# Patient Record
Sex: Female | Born: 1980 | Race: White | Hispanic: No | Smoking: Never smoker
Health system: Southern US, Community
[De-identification: ages and names within clinical notes are randomized; demographics above are authoritative.]

## PROBLEM LIST (undated history)

## (undated) ENCOUNTER — Inpatient Hospital Stay (HOSPITAL_COMMUNITY): Payer: Self-pay

## (undated) DIAGNOSIS — F99 Mental disorder, not otherwise specified: Secondary | ICD-10-CM

## (undated) DIAGNOSIS — N200 Calculus of kidney: Secondary | ICD-10-CM

## (undated) DIAGNOSIS — N390 Urinary tract infection, site not specified: Secondary | ICD-10-CM

## (undated) DIAGNOSIS — J4520 Mild intermittent asthma, uncomplicated: Secondary | ICD-10-CM

## (undated) DIAGNOSIS — J069 Acute upper respiratory infection, unspecified: Secondary | ICD-10-CM

## (undated) HISTORY — PX: TYMPANOSTOMY TUBE PLACEMENT: SHX32

## (undated) HISTORY — PX: NO PAST SURGERIES: SHX2092

## (undated) HISTORY — DX: Acute upper respiratory infection, unspecified: J06.9

## (undated) HISTORY — DX: Mild intermittent asthma, uncomplicated: J45.20

---
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
## 2000-02-20 ENCOUNTER — Other Ambulatory Visit: Admission: RE | Admit: 2000-02-20 | Discharge: 2000-02-20 | Payer: Self-pay | Admitting: Gynecology

## 2000-12-31 ENCOUNTER — Encounter: Admission: RE | Admit: 2000-12-31 | Discharge: 2000-12-31 | Payer: Self-pay | Admitting: Family Medicine

## 2000-12-31 ENCOUNTER — Encounter: Payer: Self-pay | Admitting: Family Medicine

## 2001-04-15 ENCOUNTER — Other Ambulatory Visit: Admission: RE | Admit: 2001-04-15 | Discharge: 2001-04-15 | Payer: Self-pay | Admitting: Gynecology

## 2001-04-15 ENCOUNTER — Other Ambulatory Visit: Admission: RE | Admit: 2001-04-15 | Discharge: 2001-04-15 | Payer: Self-pay | Admitting: Obstetrics and Gynecology

## 2010-09-23 ENCOUNTER — Emergency Department (HOSPITAL_COMMUNITY)
Admission: EM | Admit: 2010-09-23 | Discharge: 2010-09-24 | Payer: Self-pay | Source: Home / Self Care | Admitting: Emergency Medicine

## 2010-12-27 LAB — DIFFERENTIAL
Basophils Absolute: 0 10*3/uL (ref 0.0–0.1)
Basophils Relative: 0 % (ref 0–1)
Lymphocytes Relative: 14 % (ref 12–46)
Monocytes Relative: 7 % (ref 3–12)
Neutro Abs: 10.1 10*3/uL — ABNORMAL HIGH (ref 1.7–7.7)
Neutrophils Relative %: 77 % (ref 43–77)

## 2010-12-27 LAB — BASIC METABOLIC PANEL
BUN: 9 mg/dL (ref 6–23)
CO2: 25 mEq/L (ref 19–32)
Calcium: 9.1 mg/dL (ref 8.4–10.5)
Chloride: 107 mEq/L (ref 96–112)
Creatinine, Ser: 0.47 mg/dL (ref 0.4–1.2)
GFR calc Af Amer: >60 mL/min (ref >60)
GFR calc non Af Amer: >60 mL/min (ref >60)
Glucose, Bld: 90 mg/dL (ref 70–99)
Potassium: 3.8 mEq/L (ref 3.5–5.1)
Sodium: 140 mEq/L (ref 135–145)

## 2010-12-27 LAB — CBC
HCT: 32.7 % — ABNORMAL LOW (ref 36.0–46.0)
Hemoglobin: 10.8 g/dL — ABNORMAL LOW (ref 12.0–15.0)
MCH: 29.2 pg (ref 26.0–34.0)
MCHC: 33 g/dL (ref 30.0–36.0)
MCV: 88.4 fL (ref 78.0–100.0)
Platelets: 338 10*3/uL (ref 150–400)
RBC: 3.7 MIL/uL — ABNORMAL LOW (ref 3.87–5.11)
RDW: 13.8 % (ref 11.5–15.5)
WBC: 13.1 10*3/uL — ABNORMAL HIGH (ref 4.0–10.5)

## 2010-12-27 LAB — URINE MICROSCOPIC-ADD ON

## 2010-12-27 LAB — URINALYSIS, ROUTINE W REFLEX MICROSCOPIC
Bilirubin Urine: NEGATIVE
Glucose, UA: NEGATIVE mg/dL
Ketones, ur: NEGATIVE mg/dL
Leukocytes, UA: NEGATIVE
Nitrite: NEGATIVE
Protein, ur: 30 mg/dL — AB
Specific Gravity, Urine: 1.031 — ABNORMAL HIGH (ref 1.005–1.030)
Urobilinogen, UA: 0.2 mg/dL (ref 0.0–1.0)
pH: 6 (ref 5.0–8.0)

## 2010-12-27 LAB — URINE CULTURE
Colony Count: NO GROWTH
Culture  Setup Time: 201112100453
Culture: NO GROWTH

## 2011-02-25 ENCOUNTER — Inpatient Hospital Stay (HOSPITAL_COMMUNITY)
Admission: AD | Admit: 2011-02-25 | Discharge: 2011-02-27 | DRG: 775 | Disposition: A | Discharge status: Home / Self Care | Payer: Medicaid Other | Source: Ambulatory Visit | Attending: Obstetrics and Gynecology | Admitting: Obstetrics and Gynecology

## 2011-02-25 DIAGNOSIS — O9903 Anemia complicating the puerperium: Secondary | ICD-10-CM | POA: Diagnosis not present

## 2011-02-25 DIAGNOSIS — D500 Iron deficiency anemia secondary to blood loss (chronic): Secondary | ICD-10-CM | POA: Diagnosis not present

## 2011-02-25 LAB — CBC
Hemoglobin: 9.7 g/dL — ABNORMAL LOW (ref 12.0–15.0)
MCH: 25.7 pg — ABNORMAL LOW (ref 26.0–34.0)
RBC: 3.77 MIL/uL — ABNORMAL LOW (ref 3.87–5.11)

## 2011-02-25 LAB — RPR: RPR Ser Ql: NONREACTIVE

## 2011-02-26 LAB — CBC
HCT: 22.4 % — ABNORMAL LOW (ref 36.0–46.0)
Hemoglobin: 6.9 g/dL — CL (ref 12.0–15.0)
WBC: 17.3 10*3/uL — ABNORMAL HIGH (ref 4.0–10.5)

## 2011-02-28 LAB — RH IMMUNE GLOB WKUP(>/=20WKS)(NOT WOMEN'S HOSP)
DAT, IgG: NEGATIVE
Fetal Screen: NEGATIVE

## 2011-03-08 ENCOUNTER — Inpatient Hospital Stay (HOSPITAL_COMMUNITY): Admission: AD | Admit: 2011-03-08 | Payer: Self-pay | Source: Ambulatory Visit | Admitting: Obstetrics & Gynecology

## 2011-11-28 ENCOUNTER — Ambulatory Visit (INDEPENDENT_AMBULATORY_CARE_PROVIDER_SITE_OTHER): Payer: Self-pay | Admitting: Pediatrics

## 2011-11-28 DIAGNOSIS — Z710 Person encountering health services to consult on behalf of another person: Secondary | ICD-10-CM

## 2011-11-28 NOTE — Progress Notes (Signed)
Counseled on new patient visit

## 2011-12-13 ENCOUNTER — Ambulatory Visit: Payer: Medicaid Other | Admitting: Pediatrics

## 2012-07-02 LAB — OB RESULTS CONSOLE ANTIBODY SCREEN: Antibody Screen: NEGATIVE

## 2012-07-02 LAB — OB RESULTS CONSOLE GC/CHLAMYDIA
Chlamydia: NEGATIVE
Gonorrhea: NEGATIVE

## 2012-07-02 LAB — OB RESULTS CONSOLE RUBELLA ANTIBODY, IGM: Rubella: IMMUNE

## 2012-07-02 LAB — OB RESULTS CONSOLE ABO/RH

## 2012-08-07 ENCOUNTER — Inpatient Hospital Stay (HOSPITAL_COMMUNITY)
Admission: AD | Admit: 2012-08-07 | Discharge: 2012-08-07 | Disposition: A | Discharge status: Home / Self Care | Payer: Managed Care, Other (non HMO) | Source: Ambulatory Visit | Attending: Obstetrics & Gynecology | Admitting: Obstetrics & Gynecology

## 2012-08-07 ENCOUNTER — Encounter (HOSPITAL_COMMUNITY): Payer: Self-pay | Admitting: *Deleted

## 2012-08-07 DIAGNOSIS — O210 Mild hyperemesis gravidarum: Secondary | ICD-10-CM | POA: Insufficient documentation

## 2012-08-07 DIAGNOSIS — A088 Other specified intestinal infections: Secondary | ICD-10-CM | POA: Insufficient documentation

## 2012-08-07 DIAGNOSIS — A084 Viral intestinal infection, unspecified: Secondary | ICD-10-CM

## 2012-08-07 DIAGNOSIS — O99891 Other specified diseases and conditions complicating pregnancy: Secondary | ICD-10-CM | POA: Insufficient documentation

## 2012-08-07 HISTORY — DX: Calculus of kidney: N20.0

## 2012-08-07 HISTORY — DX: Urinary tract infection, site not specified: N39.0

## 2012-08-07 LAB — COMPREHENSIVE METABOLIC PANEL
AST: 29 U/L (ref 0–37)
BUN: 12 mg/dL (ref 6–23)
CO2: 13 mEq/L — ABNORMAL LOW (ref 19–32)
Chloride: 98 mEq/L (ref 96–112)
Creatinine, Ser: 0.48 mg/dL — ABNORMAL LOW (ref 0.50–1.10)
GFR calc non Af Amer: >90 mL/min (ref >90)
Total Bilirubin: 0.4 mg/dL (ref 0.3–1.2)

## 2012-08-07 LAB — URINE MICROSCOPIC-ADD ON

## 2012-08-07 LAB — URINALYSIS, ROUTINE W REFLEX MICROSCOPIC
Bilirubin Urine: NEGATIVE
Glucose, UA: NEGATIVE mg/dL
Ketones, ur: >80 mg/dL — AB
pH: 6 (ref 5.0–8.0)

## 2012-08-07 MED ORDER — PROMETHAZINE HCL 12.5 MG PO TABS: 12.5000 mg | ORAL_TABLET | Freq: Four times a day (QID) | ORAL | Status: DC | PRN

## 2012-08-07 MED ORDER — ONDANSETRON HCL 4 MG/2ML IJ SOLN
4.0000 mg | Freq: Once | INTRAMUSCULAR | Status: AC
  Administered 2012-08-07: 4 mg via INTRAVENOUS
  Filled 2012-08-07: qty 2

## 2012-08-07 MED ORDER — PROMETHAZINE HCL 25 MG/ML IJ SOLN
25.0000 mg | Freq: Once | INTRAVENOUS | Status: AC
  Administered 2012-08-07: 25 mg via INTRAVENOUS
  Filled 2012-08-07: qty 1

## 2012-08-07 NOTE — MAU Provider Note (Signed)
  History     CSN: [id number]  Arrival date and time: 08/07/12 6962   First Provider Initiated Contact with Patient 08/07/12 1854      Chief Complaint  Patient presents with  . Emesis  . Diarrhea   HPI Mackenzie Gomez 31 y.o. [redacted]w[redacted]d  Has had vomiting periodically through the pregnancy.  Sunday night was having abdominal pain.  Having diarrhea since Monday morning.  Unable to keep down solids or liquids since Monday.  Even has watery stools in her sleep and has to change the sheets every morning.  Tried to take Immodium but vomited it back up.  OB History    Grav Para Term Preterm Abortions TAB SAB Ect Mult Living   2 1 1       1       Past Medical History  Diagnosis Date  . Kidney stone   . Urinary tract infection     Past Surgical History  Procedure Date  . No past surgeries     History reviewed. No pertinent family history.  History  Substance Use Topics  . Smoking status: Never Smoker   . Smokeless tobacco: Not on file  . Alcohol Use: No    Allergies: Not on File  No prescriptions prior to admission    Review of Systems  Constitutional: Negative for fever.  Gastrointestinal: Positive for nausea, vomiting, abdominal pain and diarrhea. Negative for constipation.   Physical Exam   Blood pressure 99/59, pulse 86, temperature 98.1 F (36.7 C), temperature source Oral, resp. rate 20, height 5\' 2"  (1.575 m), weight 40.824 kg (90 lb).  Physical Exam  Nursing note and vitals reviewed. Constitutional: She is oriented to person, place, and time. She appears well-developed and well-nourished.  HENT:  Head: Normocephalic.  Eyes: EOM are normal.  Neck: Neck supple.  GI: Soft. Bowel sounds are normal. There is tenderness. There is no rebound and no guarding.  Musculoskeletal: Normal range of motion.  Neurological: She is alert and oriented to person, place, and time.  Skin: Skin is warm and dry.  Psychiatric: She has a normal mood and affect.    MAU Course    Procedures  MDM 9528  Consult with Dr. Arlyce Dice - IVF with Phenergan and Immodium PO.   Assessment and Plan  Care assumed by D. Poe at 32.  Mackenzie Gomez 08/07/2012, 7:04 PM

## 2012-08-07 NOTE — MAU Provider Note (Signed)
Assumed care from Nolene Bernheim, NP at 2010. IV LR w/ Phenergan infusing. Recheck at 2140: Feeling much better.  A: G2P1001 at 18wk2d  Viral gastroenteritis  P:Will discharge home with Rx Phenergan 12.5 mg tabs (Has Zofran, Imodium and Prevacid). F/U as scheduled 08/16/12. AVS on B.R.A.T. diet, diet for diarrhea  Danae Orleans, CNM 08/07/2012 9:45 PM

## 2012-08-07 NOTE — MAU Note (Signed)
Vomitting & diarrhea x 3 days, 18 weeks.  Pt states her daughter was sick with diarrhea last week.

## 2012-08-09 LAB — URINE CULTURE
Colony Count: NO GROWTH
Culture: NO GROWTH

## 2012-10-16 NOTE — L&D Delivery Note (Signed)
Delivery Note At 6:06 AM a viable and healthy female was delivered via Vaginal, Spontaneous Delivery (Presentation: Left; Occiput Anterior ).  APGAR: 8, 9; weight pending.   Placenta status: Intact, Spontaneous.  Cord: 3 vessels with a single nuchal cord and a double true Knot.  Anesthesia: Epidural Local  Episiotomy: None Lacerations: 2nd degree;Perineal Suture Repair: 3.0 vicryl Est. Blood Loss (mL): 300 cc  Mom to postpartum.  Baby to nursery-stable.  Mackenzie Barfoot H. 12/28/2012, 6:36 AM

## 2012-11-09 ENCOUNTER — Encounter (HOSPITAL_COMMUNITY): Payer: Self-pay | Admitting: *Deleted

## 2012-11-09 ENCOUNTER — Inpatient Hospital Stay (HOSPITAL_COMMUNITY)
Admission: AD | Admit: 2012-11-09 | Discharge: 2012-11-09 | Disposition: A | Discharge status: Home / Self Care | Payer: Managed Care, Other (non HMO) | Source: Ambulatory Visit | Attending: Obstetrics & Gynecology | Admitting: Obstetrics & Gynecology

## 2012-11-09 DIAGNOSIS — O479 False labor, unspecified: Secondary | ICD-10-CM

## 2012-11-09 DIAGNOSIS — K5289 Other specified noninfective gastroenteritis and colitis: Secondary | ICD-10-CM

## 2012-11-09 DIAGNOSIS — K529 Noninfective gastroenteritis and colitis, unspecified: Secondary | ICD-10-CM

## 2012-11-09 DIAGNOSIS — O4700 False labor before 37 completed weeks of gestation, unspecified trimester: Secondary | ICD-10-CM | POA: Insufficient documentation

## 2012-11-09 DIAGNOSIS — O99891 Other specified diseases and conditions complicating pregnancy: Secondary | ICD-10-CM | POA: Insufficient documentation

## 2012-11-09 HISTORY — DX: Mental disorder, not otherwise specified: F99

## 2012-11-09 LAB — URINALYSIS, ROUTINE W REFLEX MICROSCOPIC
Bilirubin Urine: NEGATIVE
Ketones, ur: >80 mg/dL — AB
Protein, ur: NEGATIVE mg/dL
Urobilinogen, UA: 0.2 mg/dL (ref 0.0–1.0)

## 2012-11-09 LAB — URINE MICROSCOPIC-ADD ON

## 2012-11-09 MED ORDER — LACTATED RINGERS IV BOLUS (SEPSIS)
1000.0000 mL | Freq: Once | INTRAVENOUS | Status: AC
  Administered 2012-11-09: 1000 mL via INTRAVENOUS

## 2012-11-09 MED ORDER — ACETAMINOPHEN 325 MG PO TABS
650.0000 mg | ORAL_TABLET | Freq: Once | ORAL | Status: AC
  Administered 2012-11-09: 650 mg via ORAL
  Filled 2012-11-09: qty 2

## 2012-11-09 MED ORDER — DEXTROSE 5 % IN LACTATED RINGERS IV BOLUS
1000.0000 mL | Freq: Once | INTRAVENOUS | Status: AC
  Administered 2012-11-09: 1000 mL via INTRAVENOUS

## 2012-11-09 MED ORDER — ONDANSETRON HCL 4 MG/2ML IJ SOLN
4.0000 mg | Freq: Once | INTRAMUSCULAR | Status: AC
  Administered 2012-11-09: 4 mg via INTRAVENOUS
  Filled 2012-11-09: qty 2

## 2012-11-09 NOTE — MAU Provider Note (Signed)
Chief Complaint:  Fever and Emesis   First Provider Initiated Contact with Patient 11/09/12 0401     HPI: Mackenzie Gomez is a 32 y.o. G2P1001 at [redacted]w[redacted]d who presents to maternity admissions reporting  Fever 102.1, N/V, body aches, runny nose, mild cough and mild sore throat since 1600 yesterday. Did not get flu vaccine. No sick contacts. Took Tylenol at 2100. Denies contractions, leakage of fluid or vaginal bleeding. Good fetal movement.   Past Medical History: Past Medical History  Diagnosis Date  . Kidney stone   . Urinary tract infection   . Mental disorder     Past obstetric history: OB History    Grav Para Term Preterm Abortions TAB SAB Ect Mult Living   2 1 1       1      # Outc Date GA Lbr Len/2nd Wgt Sex Del Anes PTL Lv   1 TRM            2 CUR               Past Surgical History: Past Surgical History  Procedure Date  . No past surgeries     Family History: History reviewed. No pertinent family history.  Social History: History  Substance Use Topics  . Smoking status: Never Smoker   . Smokeless tobacco: Not on file  . Alcohol Use: No    Allergies:  Allergies  Allergen Reactions  . Penicillins Other (See Comments)    Unknown--childhood reaction  . Latex Hives and Rash    Meds:  Prescriptions prior to admission  Medication Sig Dispense Refill  . FLUoxetine (PROZAC) 20 MG capsule Take 20 mg by mouth daily.      . folic acid (FOLVITE) 800 MCG tablet Take 800 mcg by mouth daily.       . lansoprazole (PREVACID) 30 MG capsule Take 30 mg by mouth daily.      . ondansetron (ZOFRAN) 8 MG tablet Take 8 mg by mouth every 8 (eight) hours as needed. For nausea      . promethazine (PHENERGAN) 12.5 MG tablet Take 1 tablet (12.5 mg total) by mouth every 6 (six) hours as needed for nausea.  30 tablet  0  . Pyridoxine HCl (VITAMIN B-6 PO) Take 0.5 tablets by mouth daily.        ROS: Pertinent findings in history of present illness.  Physical Exam  Blood pressure 96/52,  pulse 107, temperature 99.2 F (37.3 C), temperature source Oral, resp. rate 20, height 5\' 1"  (1.549 m), weight 48.353 kg (106 lb 9.6 oz). Patient Vitals for the past 24 hrs:  BP Temp Temp src Pulse Resp Height Weight  11/09/12 0638 99/56 mmHg - - 105  - - -  11/09/12 0532 - 99.2 F (37.3 C) Oral 107  - - -  11/09/12 0316 96/52 mmHg 101.4 F (38.6 C) Oral 111  20  - -  11/09/12 0302 - - - - - 5\' 1"  (1.549 m) 48.353 kg (106 lb 9.6 oz)    GENERAL: Well-developed, well-nourished female in no acute distress. Fatigued-appearing.  HEENT: normocephalic. Mucus membranes dry.  HEART: normal rate RESP: normal effort ABDOMEN: Soft, non-tender, gravid appropriate for gestational age EXTREMITIES: Nontender, no edema NEURO: alert and oriented SPECULUM EXAM: NEFG, physiologic discharge, no blood, cervix clean Dilation: Fingertip Effacement (%): Thick Cervical Position: Middle Station: Ballotable Exam by:: Ivonne Andrew CNMDilation: Fingertip Effacement (%): Thick Cervical Position: Middle Station: Ballotable Exam by:: Ivonne Andrew CNM  FHT:  Baseline 165 , moderate variability, accelerations present, no decelerations Contractions: q 3 mins   Labs: Results for orders placed during the hospital encounter of 11/09/12 (from the past 24 hour(s))  URINALYSIS, ROUTINE W REFLEX MICROSCOPIC     Status: Abnormal   Collection Time   11/09/12  3:00 AM      Component Value Range   Color, Urine YELLOW  YELLOW   APPearance CLEAR  CLEAR   Specific Gravity, Urine >1.030 (*) 1.005 - 1.030   pH 6.0  5.0 - 8.0   Glucose, UA NEGATIVE  NEGATIVE mg/dL   Hgb urine dipstick TRACE (*) NEGATIVE   Bilirubin Urine NEGATIVE  NEGATIVE   Ketones, ur >80 (*) NEGATIVE mg/dL   Protein, ur NEGATIVE  NEGATIVE mg/dL   Urobilinogen, UA 0.2  0.0 - 1.0 mg/dL   Nitrite NEGATIVE  NEGATIVE   Leukocytes, UA SMALL (*) NEGATIVE  URINE MICROSCOPIC-ADD ON     Status: Abnormal   Collection Time   11/09/12  3:00 AM      Component Value  Range   Squamous Epithelial / LPF FEW (*) RARE   WBC, UA 3-6  <3 WBC/hpf   RBC / HPF 3-6  <3 RBC/hpf   Bacteria, UA FEW (*) RARE    Imaging:  No results found. MAU Course: Flu PCR obtained. Zofran given, IV bolus, Tylenol per Dr. Arlyce Dice.  0500: FHR 150's, category  0620: Feeling and looking much better, No further vomiting. Tolerating POs.  Assessment: 1. Gastroenteritis, acute   2. Preterm contractions     Plan: Discharge home Preterm labor precautions and fetal kick counts Call MAU at noon for Flu PCR results. Dr. Arlyce Dice will Rx Tamilfu PRN. Increase rest and fluids.     Follow-up Information    Follow up with Mickel Baas, MD. On 11/13/2012.   Contact information:   [address] [place] [address] [phone]       Follow up with [facility] [place] [facility]. (As needed if unable to keep anythign down for more than 24 hours.)    Contact information:   [address] [id number] mc [place] [address] [phone]       If Flu PRC pos, start Tamiflu.   Medication List     As of 11/09/2012  6:25 AM    TAKE these medications         FLUoxetine 20 MG capsule   Commonly known as: PROZAC   Take 20 mg by mouth daily.      folic acid 800 MCG tablet   Commonly known as: FOLVITE   Take 800 mcg by mouth daily.      lansoprazole 30 MG capsule   Commonly known as: PREVACID   Take 30 mg by mouth daily.      ondansetron 8 MG tablet   Commonly known as: ZOFRAN   Take 8 mg by mouth every 8 (eight) hours as needed. For nausea      promethazine 12.5 MG tablet   Commonly known as: PHENERGAN   Take 1 tablet (12.5 mg total) by mouth every 6 (six) hours as needed for nausea.      VITAMIN B-6 PO   Take 0.5 tablets by mouth daily.         [place], [place] 11/09/2012 6:25 AM

## 2012-11-09 NOTE — MAU Note (Signed)
Ivonne Andrew CNM at the bedside.

## 2012-11-09 NOTE — MAU Note (Signed)
Around 1600 pt began feeling nausea, body aches  And fever of 102.3.

## 2012-11-10 LAB — URINE CULTURE
Colony Count: NO GROWTH
Culture: NO GROWTH

## 2012-12-28 ENCOUNTER — Encounter (HOSPITAL_COMMUNITY): Payer: Self-pay | Admitting: Anesthesiology

## 2012-12-28 ENCOUNTER — Inpatient Hospital Stay (HOSPITAL_COMMUNITY): Payer: Managed Care, Other (non HMO) | Admitting: Anesthesiology

## 2012-12-28 ENCOUNTER — Encounter (HOSPITAL_COMMUNITY): Payer: Self-pay | Admitting: Family Medicine

## 2012-12-28 ENCOUNTER — Inpatient Hospital Stay (HOSPITAL_COMMUNITY)
Admission: AD | Admit: 2012-12-28 | Discharge: 2012-12-30 | DRG: 774 | Disposition: A | Discharge status: Home / Self Care | Payer: Managed Care, Other (non HMO) | Source: Ambulatory Visit | Attending: Obstetrics and Gynecology | Admitting: Obstetrics and Gynecology

## 2012-12-28 DIAGNOSIS — A6000 Herpesviral infection of urogenital system, unspecified: Secondary | ICD-10-CM | POA: Diagnosis present

## 2012-12-28 DIAGNOSIS — O98519 Other viral diseases complicating pregnancy, unspecified trimester: Secondary | ICD-10-CM | POA: Diagnosis present

## 2012-12-28 LAB — CBC
HCT: 28 % — ABNORMAL LOW (ref 36.0–46.0)
Hemoglobin: 8.2 g/dL — ABNORMAL LOW (ref 12.0–15.0)
MCHC: 29.3 g/dL — ABNORMAL LOW (ref 30.0–36.0)
RBC: 3.97 MIL/uL (ref 3.87–5.11)

## 2012-12-28 LAB — RPR: RPR Ser Ql: NONREACTIVE

## 2012-12-28 MED ORDER — ACETAMINOPHEN 325 MG PO TABS: 650.0000 mg | ORAL_TABLET | ORAL | Status: DC | PRN

## 2012-12-28 MED ORDER — OXYCODONE-ACETAMINOPHEN 5-325 MG PO TABS: 1.0000 | ORAL_TABLET | ORAL | Status: DC | PRN

## 2012-12-28 MED ORDER — LACTATED RINGERS IV SOLN: INTRAVENOUS | Status: DC

## 2012-12-28 MED ORDER — SENNOSIDES-DOCUSATE SODIUM 8.6-50 MG PO TABS
2.0000 | ORAL_TABLET | Freq: Every day | ORAL | Status: DC
  Administered 2012-12-28 – 2012-12-29 (×2): 2 via ORAL

## 2012-12-28 MED ORDER — BUTORPHANOL TARTRATE 1 MG/ML IJ SOLN: 1.0000 mg | INTRAMUSCULAR | Status: DC | PRN

## 2012-12-28 MED ORDER — LIDOCAINE HCL (PF) 1 % IJ SOLN
30.0000 mL | INTRAMUSCULAR | Status: AC | PRN
  Administered 2012-12-28: 30 mL via SUBCUTANEOUS
  Filled 2012-12-28 (×2): qty 30

## 2012-12-28 MED ORDER — ZOLPIDEM TARTRATE 5 MG PO TABS: 5.0000 mg | ORAL_TABLET | Freq: Every evening | ORAL | Status: DC | PRN

## 2012-12-28 MED ORDER — METHYLERGONOVINE MALEATE 0.2 MG PO TABS: 0.2000 mg | ORAL_TABLET | ORAL | Status: DC | PRN

## 2012-12-28 MED ORDER — DIPHENHYDRAMINE HCL 25 MG PO CAPS: 25.0000 mg | ORAL_CAPSULE | Freq: Four times a day (QID) | ORAL | Status: DC | PRN

## 2012-12-28 MED ORDER — FLUOXETINE HCL 20 MG PO CAPS
20.0000 mg | ORAL_CAPSULE | Freq: Every day | ORAL | Status: DC
  Administered 2012-12-28 – 2012-12-30 (×3): 20 mg via ORAL
  Filled 2012-12-28 (×3): qty 1

## 2012-12-28 MED ORDER — IBUPROFEN 600 MG PO TABS
600.0000 mg | ORAL_TABLET | Freq: Four times a day (QID) | ORAL | Status: DC
  Administered 2012-12-28 – 2012-12-30 (×8): 600 mg via ORAL
  Filled 2012-12-28 (×10): qty 1

## 2012-12-28 MED ORDER — EPHEDRINE 5 MG/ML INJ
10.0000 mg | INTRAVENOUS | Status: DC | PRN
  Filled 2012-12-28: qty 4

## 2012-12-28 MED ORDER — OXYTOCIN BOLUS FROM INFUSION
500.0000 mL | INTRAVENOUS | Status: DC
  Administered 2012-12-28: 500 mL via INTRAVENOUS

## 2012-12-28 MED ORDER — LIDOCAINE HCL (PF) 1 % IJ SOLN
INTRAMUSCULAR | Status: DC | PRN
  Administered 2012-12-28: 6 mL
  Administered 2012-12-28: 9 mL

## 2012-12-28 MED ORDER — OXYTOCIN 40 UNITS IN LACTATED RINGERS INFUSION - SIMPLE MED
62.5000 mL/h | INTRAVENOUS | Status: DC
  Filled 2012-12-28: qty 1000

## 2012-12-28 MED ORDER — EPHEDRINE 5 MG/ML INJ: 10.0000 mg | INTRAVENOUS | Status: DC | PRN

## 2012-12-28 MED ORDER — DIPHENHYDRAMINE HCL 50 MG/ML IJ SOLN: 12.5000 mg | INTRAMUSCULAR | Status: DC | PRN

## 2012-12-28 MED ORDER — CITRIC ACID-SODIUM CITRATE 334-500 MG/5ML PO SOLN: 30.0000 mL | ORAL | Status: DC | PRN

## 2012-12-28 MED ORDER — IBUPROFEN 600 MG PO TABS
600.0000 mg | ORAL_TABLET | Freq: Four times a day (QID) | ORAL | Status: DC | PRN
  Administered 2012-12-28: 600 mg via ORAL
  Filled 2012-12-28: qty 1

## 2012-12-28 MED ORDER — TETANUS-DIPHTH-ACELL PERTUSSIS 5-2.5-18.5 LF-MCG/0.5 IM SUSP: 0.5000 mL | Freq: Once | INTRAMUSCULAR | Status: DC

## 2012-12-28 MED ORDER — ONDANSETRON HCL 4 MG/2ML IJ SOLN: 4.0000 mg | INTRAMUSCULAR | Status: DC | PRN

## 2012-12-28 MED ORDER — PHENYLEPHRINE 40 MCG/ML (10ML) SYRINGE FOR IV PUSH (FOR BLOOD PRESSURE SUPPORT)
80.0000 ug | PREFILLED_SYRINGE | INTRAVENOUS | Status: DC | PRN
  Filled 2012-12-28: qty 5

## 2012-12-28 MED ORDER — WITCH HAZEL-GLYCERIN EX PADS: 1.0000 "application " | MEDICATED_PAD | CUTANEOUS | Status: DC | PRN

## 2012-12-28 MED ORDER — ONDANSETRON HCL 4 MG PO TABS: 4.0000 mg | ORAL_TABLET | ORAL | Status: DC | PRN

## 2012-12-28 MED ORDER — LACTATED RINGERS IV SOLN: 500.0000 mL | INTRAVENOUS | Status: DC | PRN

## 2012-12-28 MED ORDER — FLEET ENEMA 7-19 GM/118ML RE ENEM: 1.0000 | ENEMA | RECTAL | Status: DC | PRN

## 2012-12-28 MED ORDER — METHYLERGONOVINE MALEATE 0.2 MG/ML IJ SOLN: 0.2000 mg | INTRAMUSCULAR | Status: DC | PRN

## 2012-12-28 MED ORDER — SIMETHICONE 80 MG PO CHEW: 80.0000 mg | CHEWABLE_TABLET | ORAL | Status: DC | PRN

## 2012-12-28 MED ORDER — PRENATAL MULTIVITAMIN CH
1.0000 | ORAL_TABLET | Freq: Every day | ORAL | Status: DC
  Administered 2012-12-28 – 2012-12-29 (×2): 1 via ORAL
  Filled 2012-12-28 (×2): qty 1

## 2012-12-28 MED ORDER — FENTANYL 2.5 MCG/ML BUPIVACAINE 1/10 % EPIDURAL INFUSION (WH - ANES)
14.0000 mL/h | INTRAMUSCULAR | Status: DC | PRN
  Filled 2012-12-28: qty 125

## 2012-12-28 MED ORDER — ONDANSETRON HCL 4 MG/2ML IJ SOLN
4.0000 mg | Freq: Four times a day (QID) | INTRAMUSCULAR | Status: DC | PRN
  Administered 2012-12-28: 4 mg via INTRAVENOUS
  Filled 2012-12-28: qty 2

## 2012-12-28 MED ORDER — FENTANYL 2.5 MCG/ML BUPIVACAINE 1/10 % EPIDURAL INFUSION (WH - ANES)
INTRAMUSCULAR | Status: DC | PRN
  Administered 2012-12-28: 14 mL/h via EPIDURAL

## 2012-12-28 MED ORDER — PHENYLEPHRINE 40 MCG/ML (10ML) SYRINGE FOR IV PUSH (FOR BLOOD PRESSURE SUPPORT): 80.0000 ug | PREFILLED_SYRINGE | INTRAVENOUS | Status: DC | PRN

## 2012-12-28 MED ORDER — LACTATED RINGERS IV SOLN
500.0000 mL | Freq: Once | INTRAVENOUS | Status: AC
  Administered 2012-12-28: 500 mL via INTRAVENOUS

## 2012-12-28 MED ORDER — BENZOCAINE-MENTHOL 20-0.5 % EX AERO
1.0000 "application " | INHALATION_SPRAY | CUTANEOUS | Status: DC | PRN
  Administered 2012-12-28: 1 via TOPICAL
  Filled 2012-12-28: qty 56

## 2012-12-28 MED ORDER — LANOLIN HYDROUS EX OINT: TOPICAL_OINTMENT | CUTANEOUS | Status: DC | PRN

## 2012-12-28 MED ORDER — OXYCODONE-ACETAMINOPHEN 5-325 MG PO TABS
1.0000 | ORAL_TABLET | ORAL | Status: DC | PRN
  Administered 2012-12-28 – 2012-12-29 (×3): 1 via ORAL
  Filled 2012-12-28 (×3): qty 1

## 2012-12-28 MED ORDER — DIBUCAINE 1 % RE OINT: 1.0000 "application " | TOPICAL_OINTMENT | RECTAL | Status: DC | PRN

## 2012-12-28 NOTE — Anesthesia Preprocedure Evaluation (Signed)
Anesthesia Evaluation  Patient identified by MRN, date of birth, ID band Patient awake    Reviewed: Allergy & Precautions, H&P , NPO status , Patient's Chart, lab work & pertinent test results  Airway Mallampati: I TM Distance: >3 FB Neck ROM: full    Dental no notable dental hx.    Pulmonary neg pulmonary ROS,    Pulmonary exam normal       Cardiovascular negative cardio ROS      Neuro/Psych negative neurological ROS     GI/Hepatic negative GI ROS, Neg liver ROS,   Endo/Other  negative endocrine ROS  Renal/GU negative Renal ROS  negative genitourinary   Musculoskeletal negative musculoskeletal ROS (+)   Abdominal Normal abdominal exam  (+)   Peds negative pediatric ROS (+)  Hematology negative hematology ROS (+)   Anesthesia Other Findings   Reproductive/Obstetrics (+) Pregnancy                           Anesthesia Physical Anesthesia Plan  ASA: II  Anesthesia Plan: Epidural   Post-op Pain Management:    Induction:   Airway Management Planned:   Additional Equipment:   Intra-op Plan:   Post-operative Plan:   Informed Consent: I have reviewed the patients History and Physical, chart, labs and discussed the procedure including the risks, benefits and alternatives for the proposed anesthesia with the patient or authorized representative who has indicated his/her understanding and acceptance.     Plan Discussed with:   Anesthesia Plan Comments:         Anesthesia Quick Evaluation

## 2012-12-28 NOTE — Anesthesia Procedure Notes (Signed)
Epidural Patient location during procedure: OB Start time: 12/28/2012 2:40 AM End time: 12/28/2012 2:44 AM  Staffing Anesthesiologist: Sandrea Hughs Performed by: anesthesiologist   Preanesthetic Checklist Completed: patient identified, site marked, surgical consent, pre-op evaluation, timeout performed, IV checked, risks and benefits discussed and monitors and equipment checked  Epidural Patient position: sitting Prep: site prepped and draped and DuraPrep Patient monitoring: continuous pulse ox and blood pressure Approach: midline Injection technique: LOR air  Needle:  Needle type: Tuohy  Needle gauge: 17 G Needle length: 9 cm and 9 Needle insertion depth: 4 cm Catheter type: closed end flexible Catheter size: 19 Gauge Catheter at skin depth: 8 cm Test dose: negative and Other  Assessment Sensory level: T9 Events: blood not aspirated, injection not painful, no injection resistance, negative IV test and no paresthesia  Additional Notes Reason for block:procedure for pain

## 2012-12-28 NOTE — Progress Notes (Signed)
MD on the phone and notified of pt status, FHR, UC pattern, SVE, SROM, and fern positive. Orders received. Will continue to monitor.

## 2012-12-28 NOTE — H&P (Signed)
Mackenzie Gomez is a 32 y.o. female presenting for labor  32 yo G2P1001 @ 38+5 presents for leaking fluid and contractions.  She was confirmed SROM and her cervix was 4 cm with contractions Q 2-3 minutes.  Her pregnancy has been complicated by anemia. 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 History OB History   Grav Para Term Preterm Abortions TAB SAB Ect Mult Living   2 1 1       1      Past Medical History  Diagnosis Date  . Kidney stone   . Urinary tract infection   . Mental disorder    Past Surgical History  Procedure Laterality Date  . No past surgeries     Family History: family history is not on file. Social History:  reports that she has never smoked. She does not have any smokeless tobacco history on file. She reports that she does not drink alcohol or use illicit drugs.   Prenatal Transfer Tool  Maternal Diabetes: No Genetic Screening: Normal Maternal Ultrasounds/Referrals: Normal Fetal Ultrasounds or other Referrals:  None Maternal Substance Abuse:  No Significant Maternal Medications:  None Significant Maternal Lab Results:  None Other Comments:  None  ROS: as above  Dilation: 10 Effacement (%): 100 Station: +2 Exam by:: T. Sprauge RN Blood pressure 114/63, pulse 96, temperature 98.9 F (37.2 C), temperature source Oral, resp. rate 18, height 5\' 2"  (1.575 m), weight 51.891 kg (114 lb 6.4 oz), SpO2 100.00%. Exam Physical Exam  Prenatal labs: ABO, Rh: A/Negative/-- (09/17 0000) Antibody: Negative (09/17 0000) Rubella: Immune (09/17 0000) RPR: Nonreactive (09/17 0000)  HBsAg: Negative (09/17 0000)  HIV: Non-reactive (09/17 0000)  GBS: Negative (02/25 0000)   Assessment/Plan: 1) Admit 2) Epidural 3) Anticipate svd  Mackenzie Gomez H. 12/28/2012, 6:38 AM

## 2012-12-28 NOTE — MAU Note (Signed)
Water broke at 2310 -clear fld. Contractions

## 2012-12-28 NOTE — Anesthesia Postprocedure Evaluation (Signed)
Anesthesia Post Note  Patient: Mackenzie Gomez  Procedure(s) Performed: * No procedures listed *  Anesthesia type: Epidural  Patient location: Mother/Baby  Post pain: Pain level controlled  Post assessment: Post-op Vital signs reviewed  Last Vitals:  Filed Vitals:   12/28/12 0945  BP: 92/52  Pulse: 75  Temp: 37.1 C  Resp: 19    Post vital signs: Reviewed  Level of consciousness:alert  Complications: No apparent anesthesia complications

## 2012-12-28 NOTE — Progress Notes (Signed)
Report called to Dana RN in BS. To BS via w/c 

## 2012-12-29 LAB — CBC
HCT: 21.8 % — ABNORMAL LOW (ref 36.0–46.0)
Hemoglobin: 6.3 g/dL — CL (ref 12.0–15.0)
MCH: 20.5 pg — ABNORMAL LOW (ref 26.0–34.0)
MCHC: 28.9 g/dL — ABNORMAL LOW (ref 30.0–36.0)
MCV: 71 fL — ABNORMAL LOW (ref 78.0–100.0)
RDW: 18.2 % — ABNORMAL HIGH (ref 11.5–15.5)

## 2012-12-29 MED ORDER — RHO D IMMUNE GLOBULIN 1500 UNIT/2ML IJ SOLN
300.0000 ug | Freq: Once | INTRAMUSCULAR | Status: AC
  Administered 2012-12-29: 300 ug via INTRAMUSCULAR
  Filled 2012-12-29: qty 2

## 2012-12-29 NOTE — Clinical Social Work Note (Signed)
CSW spoke with RN before consulting with MOB.  RN reports no concerns with depression observed.  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 MOB reports PPD was from her first child (about 2 years ago), however no current concerns.  MOB has been using prozac to stabilize depression and feels she does not need any other resources at this time.  Please reconsult CSW if further needs arise.   [phone]

## 2012-12-29 NOTE — Progress Notes (Signed)
Post Partum Day 1 Subjective: no complaints, up ad lib, voiding and tolerating PO  Objective: Blood pressure 99/63, pulse 89, temperature 98.2 F (36.8 C), temperature source Oral, resp. rate 16, height 5\' 2"  (1.575 m), weight 51.891 kg (114 lb 6.4 oz), SpO2 100.00%, unknown if currently breastfeeding.  Physical Exam:  General: alert, cooperative and appears stated age Lochia: appropriate Uterine Fundus firm   Recent Labs  12/28/12 0125 12/29/12 0620  HGB 8.2* 6.3*  HCT 28.0* 21.8*    Assessment/Plan: Breastfeeding Routine care   LOS: 1 day   Treg Diemer H. 12/29/2012, 10:06 AM

## 2012-12-30 LAB — RH IG WORKUP (INCLUDES ABO/RH)
Fetal Screen: NEGATIVE
Unit division: 0

## 2012-12-30 MED ORDER — IBUPROFEN 600 MG PO TABS: 600.0000 mg | ORAL_TABLET | Freq: Four times a day (QID) | ORAL | Status: DC

## 2012-12-30 MED ORDER — FERROUS SULFATE 325 (65 FE) MG PO TABS: 325.0000 mg | ORAL_TABLET | Freq: Three times a day (TID) | ORAL | Status: DC

## 2012-12-30 NOTE — Discharge Summary (Addendum)
Obstetric Discharge Summary Reason for Admission: rupture of membranes Prenatal Procedures: none Intrapartum Procedures: spontaneous vaginal delivery Postpartum Procedures: Rho(D) Ig Complications-Operative and Postpartum: 2nd degree perineal Hemoglobin  Date Value Range Status  12/29/2012 6.3* 12.0 - 15.0 g/dL Final     REPEATED TO VERIFY     DELTA CHECK NOTED     CRITICAL RESULT CALLED TO, READ BACK BY AND VERIFIED WITH:     EMILY HOWARD RN.@0731  ON 12/29/12 BY CALDWELL T     HCT  Date Value Range Status  12/29/2012 21.8* 36.0 - 46.0 % Final    Physical Exam:  General: alert and cooperative Lochia: appropriate Uterine Fundus: firm  DVT Evaluation: No evidence of DVT seen on physical exam.  Discharge Diagnoses: Term Pregnancy-delivered  Discharge Information: Date: 12/30/2012 Activity: pelvic rest Diet: routine Medications: PNV, Ibuprofen and Iron Condition: stable Instructions: refer to practice specific booklet Discharge to: home Follow-up Information   Follow up with Almon Hercules., MD In 4 weeks.   Contact information:   [address] [place] [address] [phone]       Newborn Data: Live born female  Birth Weight: 7 lb 5.5 oz (3330 g) APGAR: 8, 9  Home with mother.  Philip Aspen 12/30/2012, 9:38 AM

## 2014-03-30 ENCOUNTER — Ambulatory Visit (INDEPENDENT_AMBULATORY_CARE_PROVIDER_SITE_OTHER): Payer: Managed Care, Other (non HMO) | Admitting: Family Medicine

## 2014-03-30 VITALS — BP 102/66 | HR 83 | Temp 98.7°F | Resp 16 | Ht 62.0 in | Wt 91.8 lb

## 2014-03-30 DIAGNOSIS — B029 Zoster without complications: Secondary | ICD-10-CM

## 2014-03-30 MED ORDER — VALACYCLOVIR HCL 1 G PO TABS: ORAL_TABLET | ORAL | Status: DC

## 2014-03-30 NOTE — Progress Notes (Signed)
[facility] [address], [place] [id number] [phone]  Date:  03/30/2014   Name:  Mackenzie Gomez   DOB:  01/05/1981   MRN:  [id number]  PCP:  No primary provider on file.    Chief Complaint: Rash, Headache and Dizziness   History of Present Illness:  Mackenzie Gomez is a 33 y.o. very pleasant female patient who presents with the following:  Here today as a new patient   She has noted a rash on her right hip for about 2 weeks- it itches and "is starting to hurt."  She also noted a few bumps on her left foot for 2 or 3 days- these are itching. She is not sure if this might be related.    She also did notice possible migraine HA over the last week or so.  She does not usually get HA like these- she had photophobia and had to lie down and rest.  She had nausea but no vomiting. No aura.  A hot shower did help.  The HA did go away and currently she is fine in this regard  She is generally healthy, LMP about 3 weeks ago.  She is married and has 2 young sons.    There are no active problems to display for this patient.   Past Medical History  Diagnosis Date  . Kidney stone   . Urinary tract infection   . Mental disorder     Past Surgical History  Procedure Laterality Date  . No past surgeries      History  Substance Use Topics  . Smoking status: Never Smoker   . Smokeless tobacco: Not on file  . Alcohol Use: No    Family History  Problem Relation Age of Onset  . Diabetes Father   . Heart disease Father   . Hyperlipidemia Father   . Cancer Maternal Grandmother   . Diabetes Paternal Grandfather     Allergies  Allergen Reactions  . Penicillins Other (See Comments)    Unknown--childhood reaction  . Latex Hives and Rash    Medication list has been reviewed and updated.  Current Outpatient Prescriptions on File Prior to Visit  Medication Sig Dispense Refill  . ibuprofen (ADVIL,MOTRIN) 600 MG tablet Take 1 tablet (600 mg total) by mouth every 6  (six) hours.  30 tablet  0   No current facility-administered medications on file prior to visit.    Review of Systems:  As per HPI- otherwise negative.   Physical Examination: Filed Vitals:   03/30/14 1346  BP: 102/66  Pulse: 83  Temp: 98.7 F (37.1 C)  Resp: 16   Filed Vitals:   03/30/14 1346  Height: 5\' 2"  (1.575 m)  Weight: 91 lb 12.8 oz (41.64 kg)   Body mass index is 16.79 kg/(m^2). Ideal Body Weight: Weight in (lb) to have BMI = 25: 136.4  GEN: WDWN, NAD, Non-toxic, A & O x 3, slim build, looks well HEENT: Atraumatic, Normocephalic. Neck supple. No masses, No LAD. Ears and Nose: No external deformity. CV: RRR, No M/G/R. No JVD. No thrill. No extra heart sounds. PULM: CTA B, no wheezes, crackles, rhonchi. No retractions. No resp. distress. No accessory muscle use. ABD: S, NT, ND EXTR: No c/c/e NEURO Normal gait.  PSYCH: Normally interactive. Conversant. Not depressed or anxious appearing.  Calm demeanor.  likley mild shingles rash across her right hip.  It is healing but appears to be scattered, linear vesicles.   Assessment and  Plan: Shingles - Plan: valACYclovir (VALTREX) 1000 MG tablet  Likely shingles.  Mild due to her young age.  Treat with valtrex.  She will let me know if not better soon  Signed Abbe AmsterdamJessica Sarabi Sockwell, MD

## 2014-03-30 NOTE — Patient Instructions (Signed)
You may have shingles.  Take valtrex three times a day for one week, and avoid contact with pregnant women and infants.  Let me know if you are not better soon!

## 2014-06-10 ENCOUNTER — Ambulatory Visit (INDEPENDENT_AMBULATORY_CARE_PROVIDER_SITE_OTHER): Payer: Managed Care, Other (non HMO) | Admitting: Family Medicine

## 2014-06-10 VITALS — BP 94/64 | HR 71 | Temp 98.2°F | Resp 16 | Ht 61.75 in | Wt 92.4 lb

## 2014-06-10 DIAGNOSIS — L309 Dermatitis, unspecified: Secondary | ICD-10-CM

## 2014-06-10 DIAGNOSIS — L259 Unspecified contact dermatitis, unspecified cause: Secondary | ICD-10-CM

## 2014-06-10 MED ORDER — PREDNISONE 20 MG PO TABS: ORAL_TABLET | ORAL | Status: DC

## 2014-06-10 NOTE — Progress Notes (Signed)
[facility] [address] [place] [address] [phone]  Date:  06/10/2014   Name:  Mackenzie Gomez   DOB:  03-19-1981   MRN:  [id number]  PCP:  No primary provider on file.    Chief Complaint: Diaper Rash   History of Present Illness:  Mackenzie Gomez is a 33 y.o. very pleasant female patient who presents with the following:  She came in with a rash on her right hip in June- this was thought to be shingles and was treated with valtrex.  However the rash never seemed to go away completely but it did get better.  She then developed a second area on her behind about 3-4 weeks ago now.  It is also itchy.  Both of her children have eczema, so she has tried several of their topical medications and steroids   Her LMP was a week ago.   There are no active problems to display for this patient.   Past Medical History  Diagnosis Date  . Kidney stone   . Urinary tract infection   . Mental disorder     Past Surgical History  Procedure Laterality Date  . No past surgeries      History  Substance Use Topics  . Smoking status: Never Smoker   . Smokeless tobacco: Not on file  . Alcohol Use: No    Family History  Problem Relation Age of Onset  . Diabetes Father   . Heart disease Father   . Hyperlipidemia Father   . Cancer Maternal Grandmother   . Diabetes Paternal Grandfather     Allergies  Allergen Reactions  . Penicillins Other (See Comments)    Unknown--childhood reaction  . Latex Hives and Rash    Medication list has been reviewed and updated.  Current Outpatient Prescriptions on File Prior to Visit  Medication Sig Dispense Refill  . ibuprofen (ADVIL,MOTRIN) 600 MG tablet Take 1 tablet (600 mg total) by mouth every 6 (six) hours.  30 tablet  0  . valACYclovir (VALTREX) 1000 MG tablet Take 1 three times a day for possible shingles  21 tablet  0   No current facility-administered medications on file prior to visit.    Review of Systems:  As per  HPI- otherwise negative. She otherwise feels well  Physical Examination: Filed Vitals:   06/10/14 1308  BP: 94/64  Pulse: 71  Temp: 98.2 F (36.8 C)  Resp: 16   Filed Vitals:   06/10/14 1308  Height: 5' 1.75" (1.568 m)  Weight: 92 lb 6.4 oz (41.912 kg)   Body mass index is 17.05 kg/(m^2). Ideal Body Weight: Weight in (lb) to have BMI = 25: 135.3  GEN: WDWN, NAD, Non-toxic, A & O x 3, thin build HEENT: Atraumatic, Normocephalic. Neck supple. No masses, No LAD.  No oral lesions Ears and Nose: No external deformity. CV: RRR, No M/G/R. No JVD. No thrill. No extra heart sounds. PULM: CTA B, no wheezes, crackles, rhonchi. No retractions. No resp. distress. No accessory muscle use.M. EXTR: No c/c/e NEURO Normal gait.  PSYCH: Normally interactive. Conversant. Not depressed or anxious appearing.  Calm demeanor.  There is a patchy, excoriated rash on her right hip and the superior intergluteal cleft.  No scale or flaking   Assessment and Plan: Dermatitis - Plan: predniSONE (DELTASONE) 20 MG tablet, Ambulatory referral to Dermatology  Will try a prednisone taper for her rash.  Referral to derm.  She knows that she needs to gain weight,  will try to eat more.  States that she is often too busy to eat   Signed Abbe Amsterdam, MD

## 2014-06-10 NOTE — Patient Instructions (Signed)
We will refer you to dermatology.  Use the prednisone as directed- let me know if you get worse or have any other problems in the meantime.

## 2014-07-09 ENCOUNTER — Ambulatory Visit (INDEPENDENT_AMBULATORY_CARE_PROVIDER_SITE_OTHER): Payer: Managed Care, Other (non HMO) | Admitting: Family Medicine

## 2014-07-09 VITALS — BP 108/69 | HR 101 | Temp 98.6°F | Resp 16 | Ht 61.5 in | Wt 94.0 lb

## 2014-07-09 DIAGNOSIS — H9201 Otalgia, right ear: Secondary | ICD-10-CM

## 2014-07-09 DIAGNOSIS — R51 Headache: Secondary | ICD-10-CM

## 2014-07-09 DIAGNOSIS — D649 Anemia, unspecified: Secondary | ICD-10-CM | POA: Insufficient documentation

## 2014-07-09 DIAGNOSIS — R112 Nausea with vomiting, unspecified: Secondary | ICD-10-CM

## 2014-07-09 DIAGNOSIS — R519 Headache, unspecified: Secondary | ICD-10-CM

## 2014-07-09 DIAGNOSIS — H9209 Otalgia, unspecified ear: Secondary | ICD-10-CM

## 2014-07-09 DIAGNOSIS — R42 Dizziness and giddiness: Secondary | ICD-10-CM

## 2014-07-09 DIAGNOSIS — H6501 Acute serous otitis media, right ear: Secondary | ICD-10-CM

## 2014-07-09 DIAGNOSIS — H6500 Acute serous otitis media, unspecified ear: Secondary | ICD-10-CM

## 2014-07-09 LAB — POCT CBC
Granulocyte percent: 88.1 %G — AB (ref 37–80)
HEMATOCRIT: 30.2 % — AB (ref 37.7–47.9)
HEMOGLOBIN: 9.2 g/dL — AB (ref 12.2–16.2)
LYMPH, POC: 1 (ref 0.6–3.4)
MCH, POC: 22 pg — AB (ref 27–31.2)
MCHC: 30.5 g/dL — AB (ref 31.8–35.4)
MCV: 72.3 fL — AB (ref 80–97)
MID (cbc): 0.6 (ref 0–0.9)
MPV: 7.4 fL (ref 0–99.8)
POC GRANULOCYTE: 12.2 — AB (ref 2–6.9)
POC LYMPH PERCENT: 7.4 %L — AB (ref 10–50)
POC MID %: 4.5 %M (ref 0–12)
Platelet Count, POC: 470 10*3/uL — AB (ref 142–424)
RBC: 4.18 M/uL (ref 4.04–5.48)
RDW, POC: 17.9 %
WBC: 13.8 10*3/uL — AB (ref 4.6–10.2)

## 2014-07-09 MED ORDER — ONDANSETRON 4 MG PO TBDP: 4.0000 mg | ORAL_TABLET | Freq: Three times a day (TID) | ORAL | Status: DC | PRN

## 2014-07-09 MED ORDER — LEVOFLOXACIN 750 MG PO TABS: 750.0000 mg | ORAL_TABLET | Freq: Every day | ORAL | Status: DC

## 2014-07-09 MED ORDER — KETOROLAC TROMETHAMINE 60 MG/2ML IM SOLN
60.0000 mg | Freq: Once | INTRAMUSCULAR | Status: AC
  Administered 2014-07-09: 60 mg via INTRAMUSCULAR

## 2014-07-09 MED ORDER — HYDROCODONE-ACETAMINOPHEN 5-325 MG PO TABS: 1.0000 | ORAL_TABLET | Freq: Four times a day (QID) | ORAL | Status: DC | PRN

## 2014-07-09 MED ORDER — ONDANSETRON 4 MG PO TBDP
4.0000 mg | ORAL_TABLET | Freq: Once | ORAL | Status: DC
Start: 2014-07-09 — End: 2014-07-09

## 2014-07-09 MED ORDER — ONDANSETRON 4 MG PO TBDP
4.0000 mg | ORAL_TABLET | Freq: Once | ORAL | Status: AC
Start: 2014-07-09 — End: 2014-07-09
  Administered 2014-07-09: 4 mg via ORAL

## 2014-07-09 NOTE — Progress Notes (Signed)
Subjective:    Patient ID: Mackenzie Gomez, female    DOB: 26-May-1981, 33 y.o.   MRN: [id number]  HPI Mackenzie Gomez is a 33 y.o. female C/o R ear pain - noted when woke up yesterday am.  No fever, just painful. No discharge.  Stooped up some yesterday, with slight pressure/pain.  Worse overnight, more pain, more stopped up.  Now pain in all of R head from neck to ear, and all around R ear. Vomiting since woke up this am and dizzy this am.  No similar sx's prior.   Tx: ibuprofen last night, then hydrocodone twice since last night (1/2 dose of hydrocodone at 5am).  Leftover pain medicine from husband. No known history of migraine headaches.   Nothing to eat or drink today - urinated once today.    There are no active problems to display for this patient.  Past Medical History  Diagnosis Date  . Kidney stone   . Urinary tract infection   . Mental disorder    Past Surgical History  Procedure Laterality Date  . No past surgeries     Allergies  Allergen Reactions  . Penicillins Other (See Comments)    Unknown--childhood reaction  . Latex Hives and Rash   Prior to Admission medications   Medication Sig Start Date End Date Taking? Authorizing Provider  ibuprofen (ADVIL,MOTRIN) 600 MG tablet Take 1 tablet (600 mg total) by mouth every 6 (six) hours. 12/30/12  Yes Philip Aspen, DO   History   Social History  . Marital Status: Married    Spouse Name: N/A    Number of Children: N/A  . Years of Education: N/A   Occupational History  . Not on file.   Social History Main Topics  . Smoking status: Never Smoker   . Smokeless tobacco: Not on file  . Alcohol Use: No  . Drug Use: No  . Sexual Activity: Yes   Other Topics Concern  . Not on file   Social History Narrative  . No narrative on file     Review of Systems  Constitutional: Negative for fever and chills.  HENT: Positive for ear pain and hearing loss. Negative for facial swelling.   Gastrointestinal: Positive for nausea  and vomiting.  Skin: Negative for color change and rash.  Neurological: Positive for dizziness and headaches. Negative for syncope, facial asymmetry and speech difficulty.       Objective:   Physical Exam  Vitals reviewed. Constitutional: She is oriented to person, place, and time. She appears well-developed and well-nourished. She appears distressed (appears uncomfortable on exam, guarded with portions of exam. ).  HENT:  Head: Normocephalic and atraumatic.  Right Ear: External ear and ear canal normal. Tympanic membrane is erythematous. A middle ear effusion is present. Decreased hearing is noted.  Left Ear: Hearing, tympanic membrane, external ear and ear canal normal.  Nose: Nose normal.  Mouth/Throat: Uvula is midline, oropharynx is clear and moist and mucous membranes are normal. Normal dentition (no dental pain with percussion from tongue depressor. ). No dental abscesses or dental caries. No oropharyngeal exudate.  Guarded exam - retracts with light touch to paraspinals of neck to all around ear to L face. No rash.  Canal clear, purulent appearing effusion behind erythematous tm without apparent rupture and canal clear, dry.    Eyes: Conjunctivae and EOM are normal. Pupils are equal, round, and reactive to light.  Cardiovascular: Normal rate, regular rhythm, normal heart sounds and intact distal pulses.  No murmur heard. Pulmonary/Chest: Effort normal and breath sounds normal. No respiratory distress. She has no wheezes. She has no rhonchi.  Neurological: She is alert and oriented to person, place, and time.  Skin: Skin is warm and dry. No rash noted.  Psychiatric: She has a normal mood and affect. Her behavior is normal.   Filed Vitals:   07/09/14 1116  BP: 94/55  Pulse: 84  Temp: 98.6 F (37 C)  TempSrc: Oral  Resp: 16  Height: 5' 1.5" (1.562 m)  Weight: 94 lb (42.638 kg)  SpO2: 100%   Filed Vitals:   07/09/14 1116 07/09/14 1308 07/09/14 1312 07/09/14 1313  BP: 94/55  102/66 108/69 108/69  Pulse: 84 79 90 101  Temp: 98.6 F (37 C)     TempSrc: Oral     Resp: 16     Height: 5' 1.5" (1.562 m)     Weight: 94 lb (42.638 kg)     SpO2: 100%      Orthostatics positive with increase in heart rate. Recommended IVF for hydration.  Patient refused - prefers to try ORT at home.   Results for orders placed in visit on 07/09/14  POCT CBC      Result Value Ref Range   WBC 13.8 (*) 4.6 - 10.2 K/uL   Lymph, poc 1.0  0.6 - 3.4   POC LYMPH PERCENT 7.4 (*) 10 - 50 %L   MID (cbc) 0.6  0 - 0.9   POC MID % 4.5  0 - 12 %M   POC Granulocyte 12.2 (*) 2 - 6.9   Granulocyte percent 88.1 (*) 37 - 80 %G   RBC 4.18  4.04 - 5.48 M/uL   Hemoglobin 9.2 (*) 12.2 - 16.2 g/dL   HCT, POC 29.5 (*) 62.1 - 47.9 %   MCV 72.3 (*) 80 - 97 fL   MCH, POC 22.0 (*) 27 - 31.2 pg   MCHC 30.5 (*) 31.8 - 35.4 g/dL   RDW, POC 30.8     Platelet Count, POC 470 (*) 142 - 424 K/uL   MPV 7.4  0 - 99.8 fL       Assessment & Plan:   Mackenzie Gomez is a 33 y.o. female Nausea and vomiting, vomiting of unspecified type - Plan: POCT CBC, ondansetron (ZOFRAN-ODT) disintegrating tablet 4 mg, ketorolac (TORADOL) injection 60 mg, DISCONTINUED: ondansetron (ZOFRAN-ODT) disintegrating tablet 4 mg  Right acute serous otitis media, recurrence not specified - Plan: POCT CBC, ondansetron (ZOFRAN-ODT) disintegrating tablet 4 mg, ketorolac (TORADOL) injection 60 mg  Otalgia of right ear - Plan: POCT CBC, ondansetron (ZOFRAN-ODT) disintegrating tablet 4 mg, ketorolac (TORADOL) injection 60 mg  Dizzy - Plan: POCT CBC, ondansetron (ZOFRAN-ODT) disintegrating tablet 4 mg, ketorolac (TORADOL) injection 60 mg  Nausea and vomiting, vomiting of unspecified type, Active - liquid GI contents due to pain - Plan: POCT CBC, ondansetron (ZOFRAN-ODT) disintegrating tablet 4 mg, ketorolac (TORADOL) injection 60 mg, DISCONTINUED: ondansetron (ZOFRAN-ODT) disintegrating tablet 4 mg  Right-sided face pain - Plan: POCT CBC,  ondansetron (ZOFRAN-ODT) disintegrating tablet 4 mg, ketorolac (TORADOL) injection 60 mg  R otitis media with effusion, but now with dizziness, nausea, headache.  Difficult/guarded exam in regards to her face pain as withdraws with light touch of all affected areas. N/v/HA may be due to pain from otitis.   -Toradol and zofran given in office. Repeat exam - less guarding, min ttp diffusely around ear including "a little tender" over mastoid.   -levaquin  QD for 5  days given severity of sx's and PCN allergy. tendinopathy risk discussed.   -Sent home with Zofran Rx, lortab if needed for pain - SED. Can start with ibuprofen.   -ORT with frequent small sips of fluids, and ER/RTC precautions for dehydration or progression of her symptoms  -Recommended recheck with provider tomorrow to determine if mastoid ttp or need for ENT eval   Anemia - improved from last reading in 2014. Hx of anemia, not taking iron since then. Told iron deficient in past. No dark/tarry stools. Will check iron panel, start otc otc ferrous sulfate once per day, recheck CBC in next 6 weeks - advised to set up PCP - here if needed.   Meds ordered this encounter  Medications  . DISCONTD: ondansetron (ZOFRAN-ODT) disintegrating tablet 4 mg    Sig:   . ondansetron (ZOFRAN-ODT) disintegrating tablet 4 mg    Sig:   . ketorolac (TORADOL) injection 60 mg    Sig:   . ondansetron (ZOFRAN ODT) 4 MG disintegrating tablet    Sig: Take 1 tablet (4 mg total) by mouth every 8 (eight) hours as needed for nausea or vomiting.    Dispense:  10 tablet    Refill:  0  . HYDROcodone-acetaminophen (NORCO/VICODIN) 5-325 MG per tablet    Sig: Take 1 tablet by mouth every 6 (six) hours as needed for moderate pain.    Dispense:  15 tablet    Refill:  0  . levofloxacin (LEVAQUIN) 750 MG tablet    Sig: Take 1 tablet (750 mg total) by mouth daily.    Dispense:  5 tablet    Refill:  0   Patient Instructions  Start antibiotic, zofran if needed for  nausea, sips of fluids frequently, ibuprofen or hydrocodone if needed. Start iron once per day. You should receive a call or letter about your lab results within the next week to 10 days.  recheck with Maralyn Sago tomorrow. Return to the clinic or go to the nearest emergency room if any of your symptoms worsen or new symptoms occur.  Otitis Media Otitis media is redness, soreness, and inflammation of the middle ear. Otitis media may be caused by allergies or, most commonly, by infection. Often it occurs as a complication of the common cold. SIGNS AND SYMPTOMS Symptoms of otitis media may include:  Earache.  Fever.  Ringing in your ear.  Headache.  Leakage of fluid from the ear. DIAGNOSIS To diagnose otitis media, your health care provider will examine your ear with an otoscope. This is an instrument that allows your health care provider to see into your ear in order to examine your eardrum. Your health care provider also will ask you questions about your symptoms. TREATMENT  Typically, otitis media resolves on its own within 3-5 days. Your health care provider may prescribe medicine to ease your symptoms of pain. If otitis media does not resolve within 5 days or is recurrent, your health care provider may prescribe antibiotic medicines if he or she suspects that a bacterial infection is the cause. HOME CARE INSTRUCTIONS   If you were prescribed an antibiotic medicine, finish it all even if you start to feel better.  Take medicines only as directed by your health care provider.  Keep all follow-up visits as directed by your health care provider. SEEK MEDICAL CARE IF:  You have otitis media only in one ear, or bleeding from your nose, or both.  You notice a lump on your neck.  You are not getting better  in 3-5 days.  You feel worse instead of better. SEEK IMMEDIATE MEDICAL CARE IF:   You have pain that is not controlled with medicine.  You have swelling, redness, or pain around your  ear or stiffness in your neck.  You notice that part of your face is paralyzed.  You notice that the bone behind your ear (mastoid) is tender when you touch it. MAKE SURE YOU:   Understand these instructions.  Will watch your condition.  Will get help right away if you are not doing well or get worse. Document Released: 07/07/2004 Document Revised: 02/16/2014 Document Reviewed: 04/29/2013 [facility] Patient Information 2015 [place], [place]. This information is not intended to replace advice given to you by your health care provider. Make sure you discuss any questions you have with your health care provider.

## 2014-07-09 NOTE — Patient Instructions (Addendum)
Start antibiotic, zofran if needed for nausea, sips of fluids frequently, ibuprofen or hydrocodone if needed. Start iron once per day. You should receive a call or letter about your lab results within the next week to 10 days.  recheck with Maralyn Sago tomorrow. Return to the clinic or go to the nearest emergency room if any of your symptoms worsen or new symptoms occur.  Otitis Media Otitis media is redness, soreness, and inflammation of the middle ear. Otitis media may be caused by allergies or, most commonly, by infection. Often it occurs as a complication of the common cold. SIGNS AND SYMPTOMS Symptoms of otitis media may include:  Earache.  Fever.  Ringing in your ear.  Headache.  Leakage of fluid from the ear. DIAGNOSIS To diagnose otitis media, your health care provider will examine your ear with an otoscope. This is an instrument that allows your health care provider to see into your ear in order to examine your eardrum. Your health care provider also will ask you questions about your symptoms. TREATMENT  Typically, otitis media resolves on its own within 3-5 days. Your health care provider may prescribe medicine to ease your symptoms of pain. If otitis media does not resolve within 5 days or is recurrent, your health care provider may prescribe antibiotic medicines if he or she suspects that a bacterial infection is the cause. HOME CARE INSTRUCTIONS   If you were prescribed an antibiotic medicine, finish it all even if you start to feel better.  Take medicines only as directed by your health care provider.  Keep all follow-up visits as directed by your health care provider. SEEK MEDICAL CARE IF:  You have otitis media only in one ear, or bleeding from your nose, or both.  You notice a lump on your neck.  You are not getting better in 3-5 days.  You feel worse instead of better. SEEK IMMEDIATE MEDICAL CARE IF:   You have pain that is not controlled with medicine.  You have  swelling, redness, or pain around your ear or stiffness in your neck.  You notice that part of your face is paralyzed.  You notice that the bone behind your ear (mastoid) is tender when you touch it. MAKE SURE YOU:   Understand these instructions.  Will watch your condition.  Will get help right away if you are not doing well or get worse. Document Released: 07/07/2004 Document Revised: 02/16/2014 Document Reviewed: 04/29/2013 [facility] Patient Information 2015 [place], [place]. This information is not intended to replace advice given to you by your health care provider. Make sure you discuss any questions you have with your health care provider.

## 2014-07-10 LAB — IRON AND TIBC
%SAT: 7 % — AB (ref 20–55)
IRON: 28 ug/dL — AB (ref 42–145)
TIBC: 399 ug/dL (ref 250–470)
UIBC: 371 ug/dL (ref 125–400)

## 2014-07-13 ENCOUNTER — Encounter: Payer: Self-pay | Admitting: *Deleted

## 2015-10-08 ENCOUNTER — Emergency Department (HOSPITAL_COMMUNITY)
Admission: EM | Admit: 2015-10-08 | Discharge: 2015-10-09 | Disposition: A | Discharge status: Home / Self Care | Payer: Managed Care, Other (non HMO) | Attending: Emergency Medicine | Admitting: Emergency Medicine

## 2015-10-08 ENCOUNTER — Emergency Department (HOSPITAL_COMMUNITY): Payer: Managed Care, Other (non HMO)

## 2015-10-08 ENCOUNTER — Encounter (HOSPITAL_COMMUNITY): Payer: Self-pay | Admitting: *Deleted

## 2015-10-08 DIAGNOSIS — Z975 Presence of (intrauterine) contraceptive device: Secondary | ICD-10-CM | POA: Diagnosis not present

## 2015-10-08 DIAGNOSIS — Z8659 Personal history of other mental and behavioral disorders: Secondary | ICD-10-CM | POA: Insufficient documentation

## 2015-10-08 DIAGNOSIS — R102 Pelvic and perineal pain: Secondary | ICD-10-CM

## 2015-10-08 DIAGNOSIS — Z87442 Personal history of urinary calculi: Secondary | ICD-10-CM | POA: Insufficient documentation

## 2015-10-08 DIAGNOSIS — N760 Acute vaginitis: Secondary | ICD-10-CM | POA: Diagnosis not present

## 2015-10-08 DIAGNOSIS — R1030 Lower abdominal pain, unspecified: Secondary | ICD-10-CM | POA: Diagnosis present

## 2015-10-08 DIAGNOSIS — R109 Unspecified abdominal pain: Secondary | ICD-10-CM

## 2015-10-08 DIAGNOSIS — Z880 Allergy status to penicillin: Secondary | ICD-10-CM | POA: Diagnosis not present

## 2015-10-08 DIAGNOSIS — Z3202 Encounter for pregnancy test, result negative: Secondary | ICD-10-CM | POA: Diagnosis not present

## 2015-10-08 DIAGNOSIS — R112 Nausea with vomiting, unspecified: Secondary | ICD-10-CM | POA: Diagnosis not present

## 2015-10-08 DIAGNOSIS — R319 Hematuria, unspecified: Secondary | ICD-10-CM | POA: Insufficient documentation

## 2015-10-08 DIAGNOSIS — B9689 Other specified bacterial agents as the cause of diseases classified elsewhere: Secondary | ICD-10-CM

## 2015-10-08 DIAGNOSIS — R630 Anorexia: Secondary | ICD-10-CM | POA: Diagnosis not present

## 2015-10-08 DIAGNOSIS — R42 Dizziness and giddiness: Secondary | ICD-10-CM | POA: Insufficient documentation

## 2015-10-08 DIAGNOSIS — N390 Urinary tract infection, site not specified: Secondary | ICD-10-CM

## 2015-10-08 DIAGNOSIS — Z91040 Latex allergy status: Secondary | ICD-10-CM | POA: Diagnosis not present

## 2015-10-08 LAB — WET PREP, GENITAL
SPERM: NONE SEEN
TRICH WET PREP: NONE SEEN
Yeast Wet Prep HPF POC: NONE SEEN

## 2015-10-08 LAB — COMPREHENSIVE METABOLIC PANEL
ALT: 12 U/L — AB (ref 14–54)
AST: 18 U/L (ref 15–41)
Albumin: 4.1 g/dL (ref 3.5–5.0)
Alkaline Phosphatase: 46 U/L (ref 38–126)
Anion gap: 11 (ref 5–15)
BILIRUBIN TOTAL: 0.7 mg/dL (ref 0.3–1.2)
BUN: 19 mg/dL (ref 6–20)
CALCIUM: 9.1 mg/dL (ref 8.9–10.3)
CHLORIDE: 106 mmol/L (ref 101–111)
CO2: 22 mmol/L (ref 22–32)
CREATININE: 0.67 mg/dL (ref 0.44–1.00)
GFR calc Af Amer: >60 mL/min (ref >60)
Glucose, Bld: 97 mg/dL (ref 65–99)
Potassium: 3.6 mmol/L (ref 3.5–5.1)
SODIUM: 139 mmol/L (ref 135–145)
TOTAL PROTEIN: 6.8 g/dL (ref 6.5–8.1)

## 2015-10-08 LAB — URINALYSIS, ROUTINE W REFLEX MICROSCOPIC
GLUCOSE, UA: NEGATIVE mg/dL
Nitrite: NEGATIVE
PROTEIN: 30 mg/dL — AB
Specific Gravity, Urine: 1.028 (ref 1.005–1.030)
pH: 6 (ref 5.0–8.0)

## 2015-10-08 LAB — CBC
HCT: 43 % (ref 36.0–46.0)
Hemoglobin: 13.4 g/dL (ref 12.0–15.0)
MCH: 26.7 pg (ref 26.0–34.0)
MCHC: 31.2 g/dL (ref 30.0–36.0)
MCV: 85.8 fL (ref 78.0–100.0)
PLATELETS: 295 10*3/uL (ref 150–400)
RBC: 5.01 MIL/uL (ref 3.87–5.11)
RDW: 22.2 % — AB (ref 11.5–15.5)
WBC: 6.7 10*3/uL (ref 4.0–10.5)

## 2015-10-08 LAB — URINE MICROSCOPIC-ADD ON

## 2015-10-08 LAB — LIPASE, BLOOD: LIPASE: 23 U/L (ref 11–51)

## 2015-10-08 LAB — POC URINE PREG, ED: Preg Test, Ur: NEGATIVE

## 2015-10-08 MED ORDER — MORPHINE SULFATE (PF) 4 MG/ML IV SOLN
4.0000 mg | Freq: Once | INTRAVENOUS | Status: AC
  Administered 2015-10-08: 4 mg via INTRAVENOUS
  Filled 2015-10-08: qty 1

## 2015-10-08 MED ORDER — SODIUM CHLORIDE 0.9 % IV BOLUS (SEPSIS)
1000.0000 mL | Freq: Once | INTRAVENOUS | Status: AC
  Administered 2015-10-08: 1000 mL via INTRAVENOUS

## 2015-10-08 MED ORDER — ONDANSETRON 4 MG PO TBDP
8.0000 mg | ORAL_TABLET | Freq: Once | ORAL | Status: AC
  Administered 2015-10-08: 8 mg via ORAL
  Filled 2015-10-08: qty 2

## 2015-10-08 MED ORDER — ONDANSETRON HCL 4 MG/2ML IJ SOLN
4.0000 mg | Freq: Once | INTRAMUSCULAR | Status: AC
  Administered 2015-10-08: 4 mg via INTRAVENOUS
  Filled 2015-10-08: qty 2

## 2015-10-08 MED ORDER — BARIUM SULFATE 2.1 % PO SUSP
ORAL | Status: AC
  Administered 2015-10-08: 1 mL
  Filled 2015-10-08: qty 2

## 2015-10-08 NOTE — ED Notes (Signed)
The pt is c/o abd pain with n v  No diarrhea since 1800 today  lmp  Now she had a iub placed the 13th of this month  She has been bleeding since then

## 2015-10-08 NOTE — ED Provider Notes (Signed)
CSN: [id number]     Arrival date & time 10/08/15  2009 History   First MD Initiated Contact with Patient 10/08/15 2147     Chief Complaint  Patient presents with  . Abdominal Pain     (Consider location/radiation/quality/duration/timing/severity/associated sxs/prior Treatment) HPI Comments: Patient presents with lower abdominal pain nausea and vomiting onset this morning. States she's vomited between 10 and 20 times today. Denies diarrhea. Last bowel movement yesterday. States fever to 101 earlier today. Had an IUD placed on December 13 and has had intermittent bleeding since then. She has a history of iron deficiency anemia but has never been transfused. She denies any chest pain or shortness of breath. She does endorse some dizziness and lightheadedness. Denies any sick contacts or her son has had an ear infection. No recent antibiotic use or recent travel. No history of stomach issues. She denies any pain with urination or blood in the urine.  The history is provided by the patient.    Past Medical History  Diagnosis Date  . Kidney stone   . Urinary tract infection   . Mental disorder    Past Surgical History  Procedure Laterality Date  . No past surgeries     Family History  Problem Relation Age of Onset  . Diabetes Father   . Heart disease Father   . Hyperlipidemia Father   . Cancer Maternal Grandmother   . Diabetes Paternal Grandfather    Social History  Substance Use Topics  . Smoking status: Never Smoker   . Smokeless tobacco: None  . Alcohol Use: No   OB History    Gravida Para Term Preterm AB TAB SAB Ectopic Multiple Living   Review of Systems  Constitutional: Positive for fever, activity change and appetite change.  HENT: Negative for congestion and rhinorrhea.   Respiratory: Negative for cough, chest tightness and shortness of breath.   Cardiovascular: Negative for chest pain.  Gastrointestinal: Positive for nausea, vomiting and  abdominal pain. Negative for diarrhea and constipation.  Genitourinary: Positive for dysuria, hematuria and vaginal bleeding.  Musculoskeletal: Negative for myalgias and arthralgias.  Neurological: Positive for dizziness and light-headedness. Negative for weakness and headaches.  A complete 10 system review of systems was obtained and all systems are negative except as noted in the HPI and PMH.      Allergies  Penicillins and Latex  Home Medications   Prior to Admission medications   Medication Sig Start Date End Date Taking? Authorizing Provider  ibuprofen (ADVIL,MOTRIN) 200 MG tablet Take 800 mg by mouth every 6 (six) hours as needed for moderate pain.   Yes Historical Provider, MD  levonorgestrel (MIRENA) 20 MCG/24HR IUD 1 each by Intrauterine route once.   Yes Historical Provider, MD  cephALEXin (KEFLEX) 500 MG capsule Take 1 capsule (500 mg total) by mouth 4 (four) times daily. 10/09/15   Glynn Octave, MD  metroNIDAZOLE (FLAGYL) 500 MG tablet Take 1 tablet (500 mg total) by mouth 2 (two) times daily. 10/09/15   Glynn Octave, MD  ondansetron (ZOFRAN) 4 MG tablet Take 1 tablet (4 mg total) by mouth every 6 (six) hours. 10/09/15   Glynn Octave, MD   BP 101/62 mmHg  Pulse 88  Temp(Src) 99.9 F (37.7 C) (Rectal)  Resp 16  Ht  (1.575 m)  Wt 95 lb (43.092 kg)  BMI 17.37 kg/m2  SpO2 100%  LMP 10/08/2015 Physical Exam  Constitutional: She  is oriented to person, place, and time. She appears well-developed and well-nourished. No distress.  HENT:  Head: Normocephalic and atraumatic.  Mouth/Throat: Oropharynx is clear and moist. No oropharyngeal exudate.  Eyes: Conjunctivae and EOM are normal. Pupils are equal, round, and reactive to light.  Neck: Normal range of motion. Neck supple.  No meningismus.  Cardiovascular: Normal rate, regular rhythm, normal heart sounds and intact distal pulses.   No murmur heard. Pulmonary/Chest: Effort normal and breath sounds normal. No  respiratory distress.  Abdominal: Soft. There is tenderness. There is no rebound and no guarding.  Diffuse lower abdominal tenderness with voluntary guarding.  Genitourinary:  . Normal external genitalia. Dark blood in vaginal vault. No CMT. IUD strings visible. No lateralizing adnexal tenderness.  Musculoskeletal: Normal range of motion. She exhibits no edema or tenderness.  No CVA tenderness  Neurological: She is alert and oriented to person, place, and time. No cranial nerve deficit. She exhibits normal muscle tone. Coordination normal.  No ataxia on finger to nose bilaterally. No pronator drift. 5/5 strength throughout. CN 2-12 intact.Equal grip strength. Sensation intact.   Skin: Skin is warm.  Psychiatric: She has a normal mood and affect. Her behavior is normal.  Nursing note and vitals reviewed.   ED Course  Procedures (including critical care time) Labs Review Labs Reviewed  WET PREP, GENITAL - Abnormal; Notable for the following:    Clue Cells Wet Prep HPF POC PRESENT (*)    WBC, Wet Prep HPF POC FEW (*)    All other components within normal limits  COMPREHENSIVE METABOLIC PANEL - Abnormal; Notable for the following:    ALT 12 (*)    All other components within normal limits  CBC - Abnormal; Notable for the following:    RDW 22.2 (*)    All other components within normal limits  URINALYSIS, ROUTINE W REFLEX MICROSCOPIC (NOT AT [facility]) - Abnormal; Notable for the following:    Color, Urine AMBER (*)    APPearance CLOUDY (*)    Hgb urine dipstick LARGE (*)    Bilirubin Urine SMALL (*)    Ketones, ur >80 (*)    Protein, ur 30 (*)    Leukocytes, UA MODERATE (*)    All other components within normal limits  URINE MICROSCOPIC-ADD ON - Abnormal; Notable for the following:    Squamous Epithelial / LPF 0-5 (*)    Bacteria, UA MANY (*)    All other components within normal limits  URINE CULTURE  LIPASE, BLOOD  POC URINE PREG, ED  GC/CHLAMYDIA PROBE AMP ([place]) NOT AT  [facility]    Imaging Review US Transvaginal Non-ob  10/09/2015  CLINICAL DATA:  34 year old female with IUD placed 13 days ago. Patient presenting with pelvic pain and bleeding. EXAM: TRANSABDOMINAL AND TRANSVAGINAL ULTRASOUND OF PELVIS DOPPLER ULTRASOUND OF OVARIES TECHNIQUE: Both transabdominal and transvaginal ultrasound examinations of the pelvis were performed. Transabdominal technique was performed for global imaging of the pelvis including uterus, ovaries, adnexal regions, and pelvic cul-de-sac. It was necessary to proceed with endovaginal exam following the transabdominal exam to visualize the endometrium and the ovaries. Color and duplex Doppler ultrasound was utilized to evaluate blood flow to the ovaries. COMPARISON:  None. FINDINGS: Uterus: Measurements: The uterus is anteverted and measures 9.0 x 4.3 x 5.1 cm. The uterus demonstrates a heterogeneous echotexture without focal mass. Endometrium Thickness: 1 cm.  An intrauterine device is noted. Right ovary Measurements: 3.1 x 1.3 x 2.0 cm. Normal appearance/no adnexal mass. Left ovary Measurements:  3.4 x 2.5 x 2.8 cm. There multiple cysts within the left ovary. There is a 1.3 x 1.1 x 1.1 cm hemorrhagic cyst in the left ovary. Pulsed Doppler evaluation of both ovaries demonstrates normal low-resistance arterial and venous waveforms. Other findings Small free fluid within the pelvis. IMPRESSION: Mildly heterogeneous uterus with an intrauterine device. No focal lesion. Left ovarian hemorrhagic cyst. Follow-up after 2 menstrual cycles recommended. Bilateral ovarian Doppler flow. Electronically Signed   By: Arash  Radparvar M.D.   On: 10/09/2015 00:20   Us Pelvis Complete  10/09/2015  CLINICAL DATA:  34 year old female with IUD placed 13 days ago. Patient presenting with pelvic pain and bleeding. EXAM: TRANSABDOMINAL AND TRANSVAGINAL ULTRASOUND OF PELVIS DOPPLER ULTRASOUND OF OVARIES TECHNIQUE: Both transabdominal and transvaginal ultrasound examinations  of the pelvis were performed. Transabdominal technique was performed for global imaging of the pelvis including uterus, ovaries, adnexal regions, and pelvic cul-de-sac. It was necessary to proceed with endovaginal exam following the transabdominal exam to visualize the endometrium and the ovaries. Color and duplex Doppler ultrasound was utilized to evaluate blood flow to the ovaries. COMPARISON:  None. FINDINGS: Uterus: Measurements: The uterus is anteverted and measures 9.0 x 4.3 x 5.1 cm. The uterus demonstrates a heterogeneous echotexture without focal mass. Endometrium Thickness: 1 cm.  An intrauterine device is noted. Right ovary Measurements: 3.1 x 1.3 x 2.0 cm. Normal appearance/no adnexal mass. Left ovary Measurements: 3.4 x 2.5 x 2.8 cm. There multiple cysts within the left ovary. There is a 1.3 x 1.1 x 1.1 cm hemorrhagic cyst in the left ovary. Pulsed Doppler evaluation of both ovaries demonstrates normal low-resistance arterial and venous waveforms. Other findings Small free fluid within the pelvis. IMPRESSION: Mildly heterogeneous uterus with an intrauterine device. No focal lesion. Left ovarian hemorrhagic cyst. Follow-up after 2 menstrual cycles recommended. Bilateral ovarian Doppler flow. Electronically Signed   By: Arash  Radparvar M.D.   On: 10/09/2015 00:20   Ct Abdomen Pelvis W Contrast  10/09/2015  CLINICAL DATA:  Acute onset of generalized abdominal pain, nausea and vomiting. Vaginal bleeding. Initial encounter. EXAM: CT ABDOMEN AND PELVIS WITH CONTRAST TECHNIQUE: Multidetector CT imaging of the abdomen and pelvis was performed using the standard protocol following bolus administration of intravenous contrast. CONTRAST:  75mLMarcTexasell([id number]-[id number])[id number])M86mtJMarcTexasel661-M87mtJMarcTexasel219 M51mtJMarcTexasel819-M32mtJMarc[id number])M59mtJMarcTexasel470-M79mtJMarcTexasell914-M9mtJMarcTex[id number])[id number])M25mtJMarcTexasel682-M20mtJMarcTexasel937-M49mtJMarcTexasel406-M76mtJMarcTexasell214-M20mtJMarcTexas606-M69mtJMarcTexasel662-M94mtJMarcTexasel97M31mtJMarcTexasel775 M72mtJMarcTexas585-MotJenne CampusIOHEXOL 300 MG/ML  SOLN COMPARISON:  Pelvic ultrasound performed 10/08/2015 FINDINGS: The visualized lung bases are clear. The liver and spleen are unremarkable in appearance. The gallbladder is within normal limits. The pancreas and adrenal glands are  unremarkable. A 3 mm nonobstructing stone is noted at the upper pole of the right kidney. Small bilateral renal cysts are seen, measuring up to 1.0 cm in size. There is no evidence of hydronephrosis. No renal or ureteral stones are seen. No perinephric stranding is appreciated. The small bowel is unremarkable in appearance. The stomach is within normal limits. No acute vascular abnormalities are seen. The appendix is normal in caliber and contains contrast, without evidence of appendicitis. Contrast progresses to the level of the rectum. The colon is unremarkable in appearance. The bladder is decompressed and not well assessed. The uterus is grossly unremarkable in appearance, with an intrauterine device noted at the uterine fundus. Trace fluid is seen tracking about the pelvis, likely physiologic in nature. No inguinal lymphadenopathy is seen. No acute osseous abnormalities are identified. IMPRESSION: 1. No acute abnormality seen within the abdomen or pelvis. 2. Intrauterine device noted in expected position at the uterine fundus. 3. 3 mm nonobstructing stone at the upper pole of the  right kidney. Small bilateral renal cysts seen. Electronically Signed   By: Roanna Raider M.D.   On: 10/09/2015 01:28   [place] Art/ven Flow Abd Pelv Doppler  10/09/2015  CLINICAL DATA:  34 year old female with IUD placed 13 days ago. Patient presenting with pelvic pain and bleeding. EXAM: TRANSABDOMINAL AND TRANSVAGINAL ULTRASOUND OF PELVIS DOPPLER ULTRASOUND OF OVARIES TECHNIQUE: Both transabdominal and transvaginal ultrasound examinations of the pelvis were performed. Transabdominal technique was performed for global imaging of the pelvis including uterus, ovaries, adnexal regions, and pelvic cul-de-sac. It was necessary to proceed with endovaginal exam following the transabdominal exam to visualize the endometrium and the ovaries. Color and duplex Doppler ultrasound was utilized to evaluate blood flow to the ovaries. COMPARISON:   None. FINDINGS: Uterus: Measurements: The uterus is anteverted and measures 9.0 x 4.3 x 5.1 cm. The uterus demonstrates a heterogeneous echotexture without focal mass. Endometrium Thickness: 1 cm.  An intrauterine device is noted. Right ovary Measurements: 3.1 x 1.3 x 2.0 cm. Normal appearance/no adnexal mass. Left ovary Measurements: 3.4 x 2.5 x 2.8 cm. There multiple cysts within the left ovary. There is a 1.3 x 1.1 x 1.1 cm hemorrhagic cyst in the left ovary. Pulsed Doppler evaluation of both ovaries demonstrates normal low-resistance arterial and venous waveforms. Other findings Small free fluid within the pelvis. IMPRESSION: Mildly heterogeneous uterus with an intrauterine device. No focal lesion. Left ovarian hemorrhagic cyst. Follow-up after 2 menstrual cycles recommended. Bilateral ovarian Doppler flow. Electronically Signed   By: Elgie Collard M.D.   On: 10/09/2015 00:20   I have personally reviewed and evaluated these images and lab results as part of my medical decision-making.   EKG Interpretation None      MDM   Final diagnoses:  Abdominal pain  Female pelvic pain  Urinary tract infection with hematuria, site unspecified  Bacterial vaginosis   lower abdominal pain with vomiting since this morning. Recent IUD placement 2 weeks ago. Fever to 101.  Urinalysis shows ketones with many bacteria, blood and leukocytes. IUD in place on pelvic exam.  WBC normal, afebrile.  Treat for possible UTI. IMaging shows ovarian cyst, no torsion.  CT with normal appendix.  No IUD complications.  Tolerating PO in the ED without vomiting. Pain controlled.  Treat UTI, BV.  followup with GYN.  Return precautions discussed.  BP 101/62 mmHg  Pulse 88  Temp(Src) 99.9 F (37.7 C) (Rectal)  Resp 16  Ht  (1.575 m)  Wt 95 lb (43.092 kg)  BMI 17.37 kg/m2  SpO2 100%  LMP 10/08/2015    Glynn Octave, MD 10/09/15 0230

## 2015-10-09 ENCOUNTER — Encounter (HOSPITAL_COMMUNITY): Payer: Self-pay | Admitting: Radiology

## 2015-10-09 ENCOUNTER — Emergency Department (HOSPITAL_COMMUNITY): Payer: Managed Care, Other (non HMO)

## 2015-10-09 MED ORDER — METRONIDAZOLE 500 MG PO TABS: 500.0000 mg | ORAL_TABLET | Freq: Two times a day (BID) | ORAL | Status: DC

## 2015-10-09 MED ORDER — ONDANSETRON HCL 4 MG PO TABS: 4.0000 mg | ORAL_TABLET | Freq: Four times a day (QID) | ORAL | Status: DC

## 2015-10-09 MED ORDER — CEPHALEXIN 500 MG PO CAPS: 500.0000 mg | ORAL_CAPSULE | Freq: Four times a day (QID) | ORAL | Status: DC

## 2015-10-09 MED ORDER — IOHEXOL 300 MG/ML  SOLN
80.0000 mL | Freq: Once | INTRAMUSCULAR | Status: AC | PRN
  Administered 2015-10-09: 80 mL via INTRAVENOUS

## 2015-10-09 MED ORDER — SODIUM CHLORIDE 0.9 % IV BOLUS (SEPSIS)
1000.0000 mL | Freq: Once | INTRAVENOUS | Status: DC
Start: 2015-10-09 — End: 2015-10-09

## 2015-10-09 NOTE — Discharge Instructions (Signed)
Urinary Tract Infection Follow up with your doctor this week. Return to the  ED if you develop new or worsening symptoms. Urinary tract infections (UTIs) can develop anywhere along your urinary tract. Your urinary tract is your body's drainage system for removing wastes and extra water. Your urinary tract includes two kidneys, two ureters, a bladder, and a urethra. Your kidneys are a pair of bean-shaped organs. Each kidney is about the size of your fist. They are located below your ribs, one on each side of your spine. CAUSES Infections are caused by microbes, which are microscopic organisms, including fungi, viruses, and bacteria. These organisms are so small that they can only be seen through a microscope. Bacteria are the microbes that most commonly cause UTIs. SYMPTOMS  Symptoms of UTIs may vary by age and gender of the patient and by the location of the infection. Symptoms in young women typically include a frequent and intense urge to urinate and a painful, burning feeling in the bladder or urethra during urination. Older women and men are more likely to be tired, shaky, and weak and have muscle aches and abdominal pain. A fever may mean the infection is in your kidneys. Other symptoms of a kidney infection include pain in your back or sides below the ribs, nausea, and vomiting. DIAGNOSIS To diagnose a UTI, your caregiver will ask you about your symptoms. Your caregiver will also ask you to provide a urine sample. The urine sample will be tested for bacteria and white blood cells. White blood cells are made by your body to help fight infection. TREATMENT  Typically, UTIs can be treated with medication. Because most UTIs are caused by a bacterial infection, they usually can be treated with the use of antibiotics. The choice of antibiotic and length of treatment depend on your symptoms and the type of bacteria causing your infection. HOME CARE INSTRUCTIONS  If you were prescribed antibiotics, take  them exactly as your caregiver instructs you. Finish the medication even if you feel better after you have only taken some of the medication.  Drink enough water and fluids to keep your urine clear or pale yellow.  Avoid caffeine, tea, and carbonated beverages. They tend to irritate your bladder.  Empty your bladder often. Avoid holding urine for long periods of time.  Empty your bladder before and after sexual intercourse.  After a bowel movement, women should cleanse from front to back. Use each tissue only once. SEEK MEDICAL CARE IF:   You have back pain.  You develop a fever.  Your symptoms do not begin to resolve within 3 days. SEEK IMMEDIATE MEDICAL CARE IF:   You have severe back pain or lower abdominal pain.  You develop chills.  You have nausea or vomiting.  You have continued burning or discomfort with urination. MAKE SURE YOU:   Understand these instructions.  Will watch your condition.  Will get help right away if you are not doing well or get worse.   This information is not intended to replace advice given to you by your health care provider. Make sure you discuss any questions you have with your health care provider.   Document Released: 07/12/2005 Document Revised: 06/23/2015 Document Reviewed: 11/10/2011 Elsevier Interactive Patient Education [employer]2016 Elsevier Inc.

## 2015-10-09 NOTE — ED Notes (Signed)
Pt transported to CT ?

## 2015-10-10 LAB — URINE CULTURE

## 2015-10-15 LAB — GC/CHLAMYDIA PROBE AMP (~~LOC~~) NOT AT ARMC
Chlamydia: NEGATIVE
NEISSERIA GONORRHEA: NEGATIVE

## 2015-11-18 ENCOUNTER — Other Ambulatory Visit: Payer: Self-pay | Admitting: Obstetrics & Gynecology

## 2015-11-18 DIAGNOSIS — N6320 Unspecified lump in the left breast, unspecified quadrant: Secondary | ICD-10-CM

## 2015-11-23 ENCOUNTER — Ambulatory Visit
Admission: RE | Admit: 2015-11-23 | Discharge: 2015-11-23 | Disposition: A | Discharge status: Home / Self Care | Payer: Managed Care, Other (non HMO) | Source: Ambulatory Visit | Attending: Obstetrics & Gynecology | Admitting: Obstetrics & Gynecology

## 2015-11-23 DIAGNOSIS — N6320 Unspecified lump in the left breast, unspecified quadrant: Secondary | ICD-10-CM

## 2016-05-15 IMAGING — CT CT ABD-PELV W/ CM
2 of 5 series · 13 of 46 positions shown, 15 images · IV contrast (Iodine)
Comparison: Pelvic ultrasound performed 10/08/2015

CLINICAL DATA: Acute onset of generalized abdominal pain, nausea
and vomiting. Vaginal bleeding. Initial encounter.

EXAM:
CT ABDOMEN AND PELVIS WITH CONTRAST
TECHNIQUE: Multidetector CT imaging of the abdomen and pelvis was performed
using the standard protocol following bolus administration of
intravenous contrast.
CONTRAST:  80mL OMNIPAQUE IOHEXOL 300 MG/ML  SOLN

[Series 201: routine, idose (2) · axial · 0.64mm/px · z∈[-63,+237]mm · 10 of 70 slices shown, 12 images]
[im 5/70  soft-tissue]
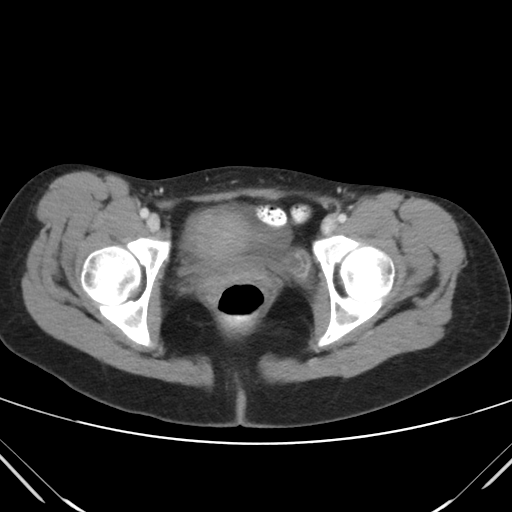
[im 5/70  bone]
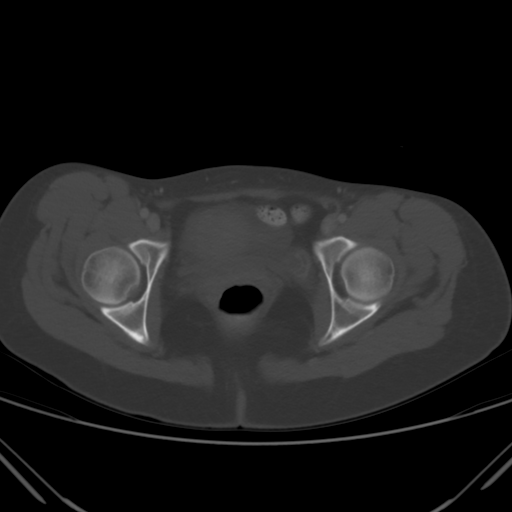
[im 13/70  soft-tissue]
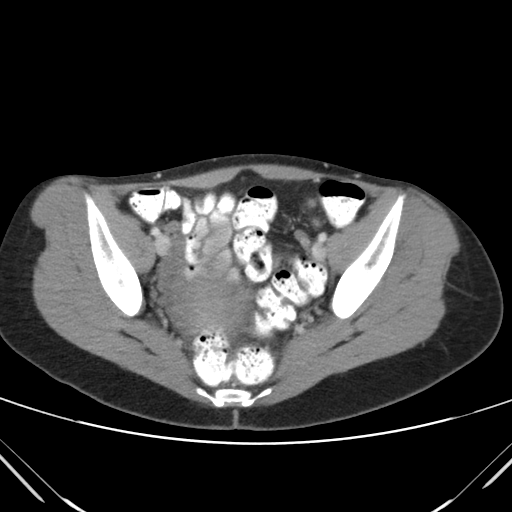
[im 21/70  soft-tissue]
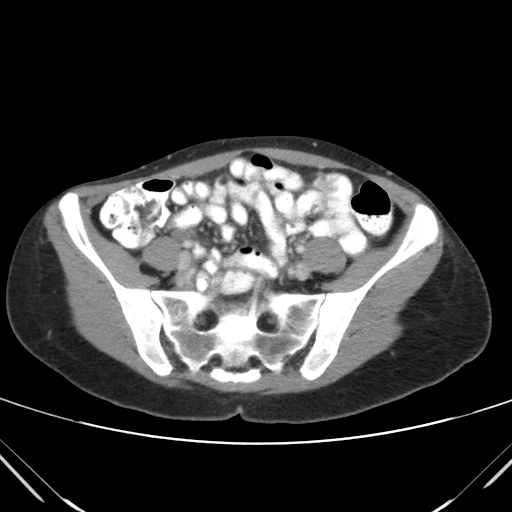
[im 25/70  soft-tissue]
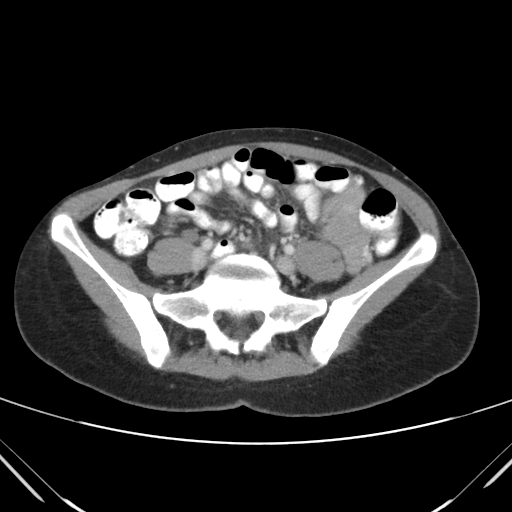
[im 33/70  soft-tissue]
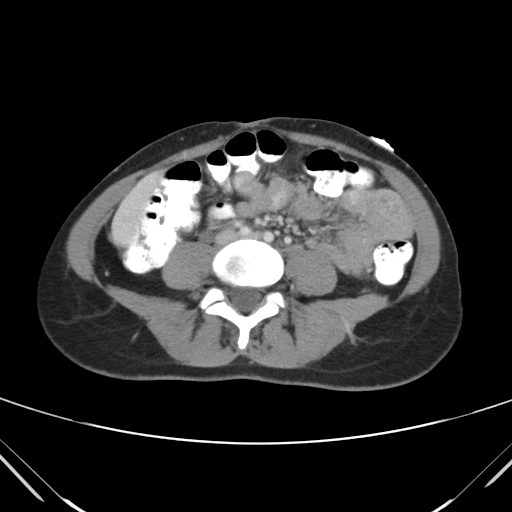
[im 37/70  soft-tissue]
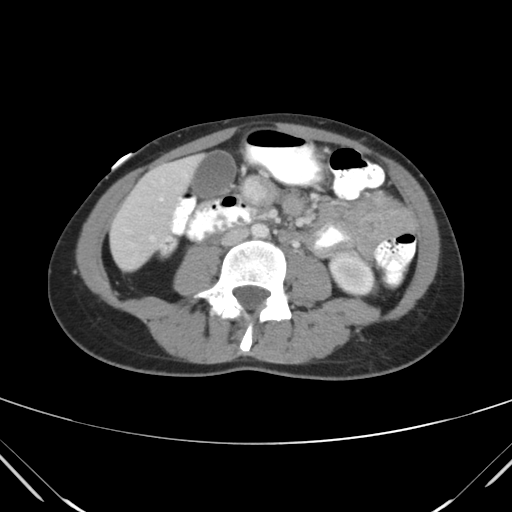
[im 45/70  soft-tissue]
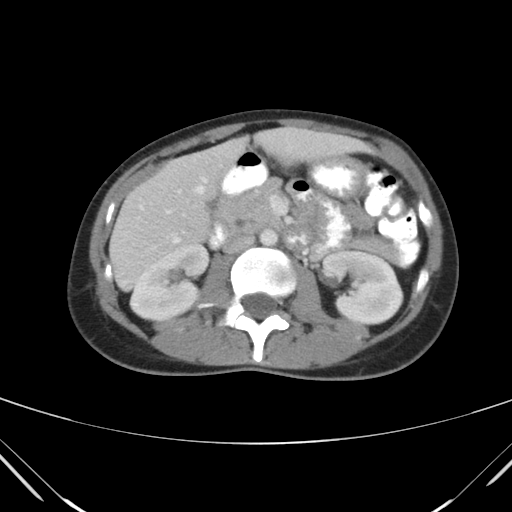
[im 53/70  soft-tissue]
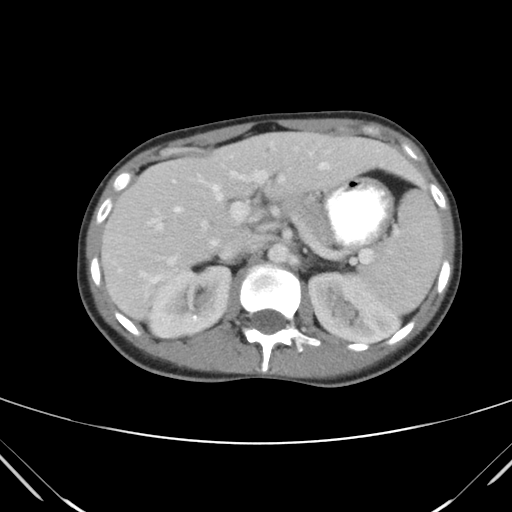
[im 57/70  soft-tissue]
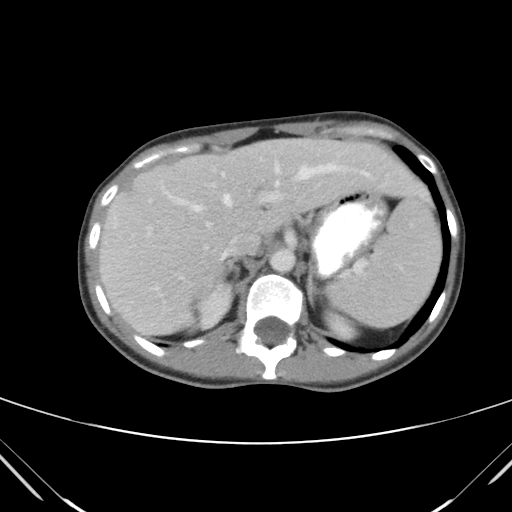
[im 57/70  bone]
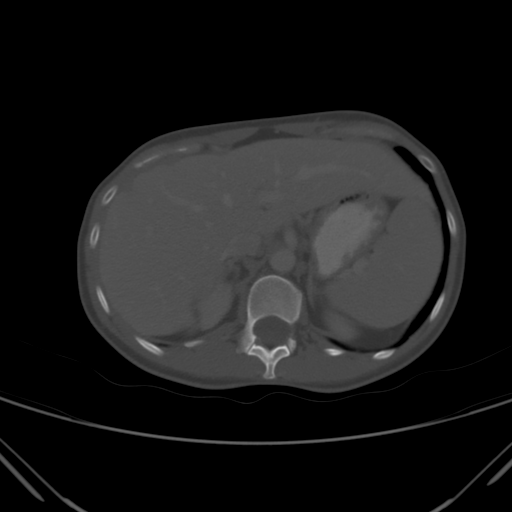
[im 65/70  soft-tissue]
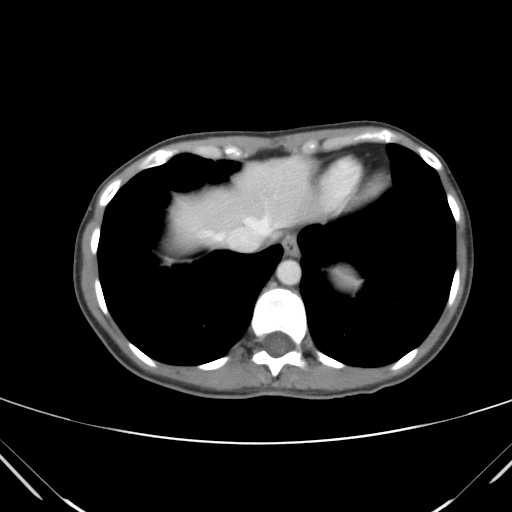

[Series 203: coronals, idose (2) · coronal · 0.45mm/px · 3 of 94 slices shown]
[im 32/94  soft-tissue]
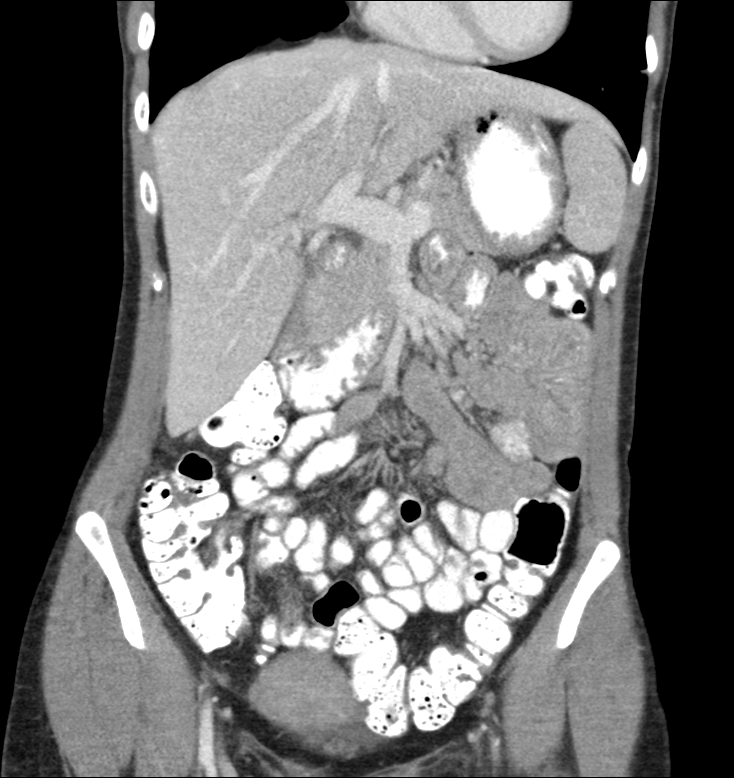
[im 42/94  soft-tissue]
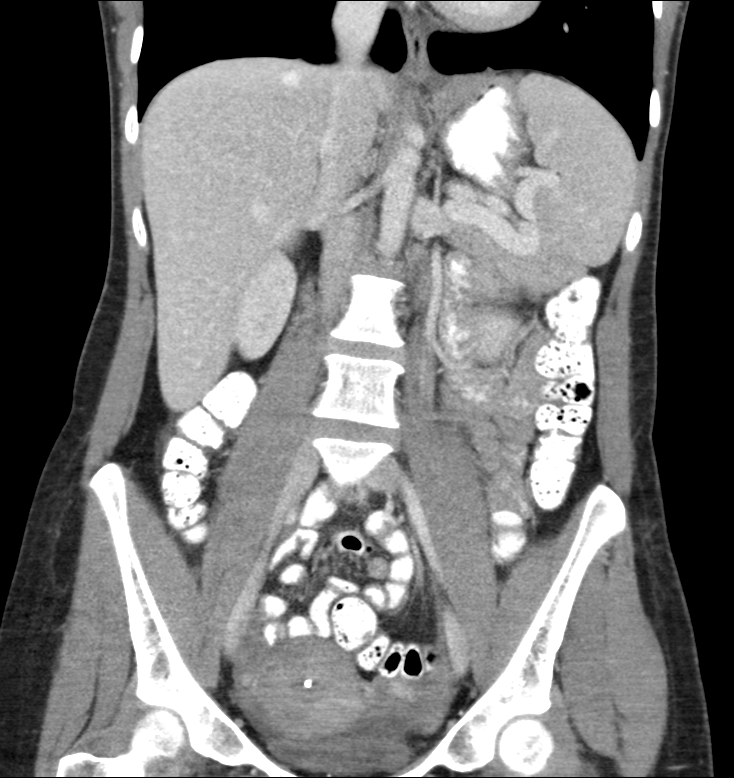
[im 52/94  soft-tissue]
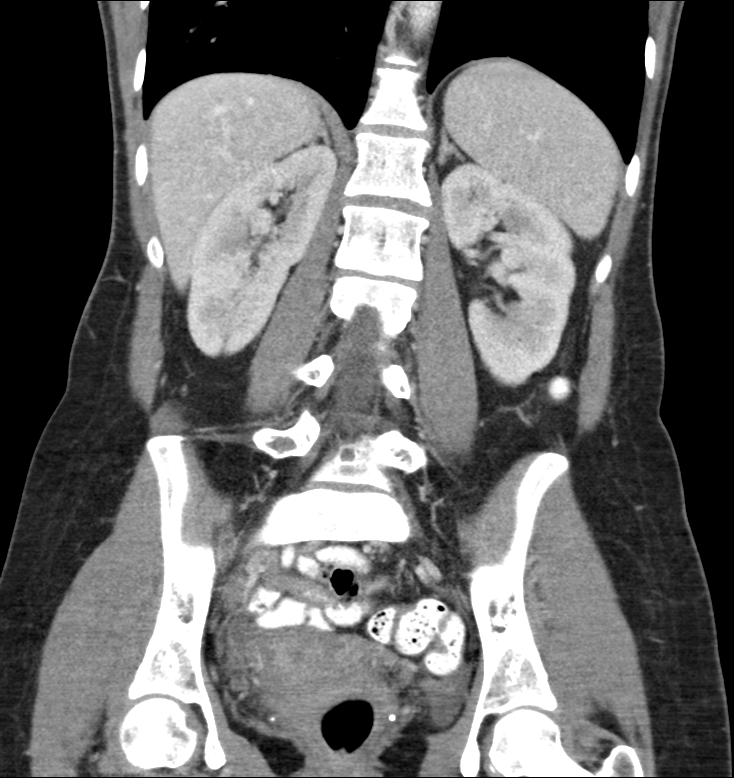

[13 of 46 positions shown; findings below may reference images not displayed]

FINDINGS: The visualized lung bases are clear.

The liver and spleen are unremarkable in appearance. The gallbladder
is within normal limits. The pancreas and adrenal glands are
unremarkable.

A 3 mm nonobstructing stone is noted at the upper pole of the right
kidney. Small bilateral renal cysts are seen, measuring up to 1.0 cm
in size. There is no evidence of hydronephrosis. No renal or
ureteral stones are seen. No perinephric stranding is appreciated.

The small bowel is unremarkable in appearance. The stomach is within
normal limits. No acute vascular abnormalities are seen.

The appendix is normal in caliber and contains contrast, without
evidence of appendicitis. Contrast progresses to the level of the
rectum. The colon is unremarkable in appearance.

The bladder is decompressed and not well assessed. The uterus is
grossly unremarkable in appearance, with an intrauterine device
noted at the uterine fundus. Trace fluid is seen tracking about the
pelvis, likely physiologic in nature. No inguinal lymphadenopathy is
seen.

No acute osseous abnormalities are identified.
IMPRESSION: 1. No acute abnormality seen within the abdomen or pelvis.
2. Intrauterine device noted in expected position at the uterine
fundus.
3. 3 mm nonobstructing stone at the upper pole of the right kidney.
Small bilateral renal cysts seen.

## 2017-01-21 ENCOUNTER — Telehealth: Payer: Managed Care, Other (non HMO) | Admitting: Family

## 2017-01-21 DIAGNOSIS — N390 Urinary tract infection, site not specified: Secondary | ICD-10-CM

## 2017-01-21 DIAGNOSIS — R319 Hematuria, unspecified: Secondary | ICD-10-CM

## 2017-01-21 MED ORDER — NITROFURANTOIN MONOHYD MACRO 100 MG PO CAPS: 100.0000 mg | ORAL_CAPSULE | Freq: Two times a day (BID) | ORAL | 0 refills | Status: DC

## 2017-01-21 NOTE — Progress Notes (Signed)

## 2017-02-02 ENCOUNTER — Other Ambulatory Visit: Payer: Self-pay | Admitting: Family

## 2017-02-02 ENCOUNTER — Telehealth: Payer: Managed Care, Other (non HMO) | Admitting: Family

## 2017-02-02 DIAGNOSIS — N390 Urinary tract infection, site not specified: Secondary | ICD-10-CM

## 2017-02-02 DIAGNOSIS — R319 Hematuria, unspecified: Principal | ICD-10-CM

## 2017-02-02 MED ORDER — CIPROFLOXACIN HCL 500 MG PO TABS: 500.0000 mg | ORAL_TABLET | Freq: Two times a day (BID) | ORAL | 0 refills | Status: DC

## 2017-02-02 NOTE — Progress Notes (Signed)
We are sorry that you are not feeling well.  Here is how we plan to help!  Based on what you shared with me it looks like you most likely have a simple urinary tract infection.  A UTI (Urinary Tract Infection) is a bacterial infection of the bladder.  Most cases of urinary tract infections are simple to treat but a key part of your care is to encourage you to drink plenty of fluids and watch your symptoms carefully.  I have prescribed Ciprofloxacin 500 mg twice a day for 5 days.  Your symptoms should gradually improve. Call us if the burning in your urine worsens, you develop worsening fever, back pain or pelvic pain or if your symptoms do not resolve after completing the antibiotic.  I see you had macrobid a couple weeks ago. You may have residual irritation from a UTI at that time, or the bacteria was resistant to that class of antibiotics. Therefore, we can try a new one. If this does not help, you need to be seen face-to-face for recurrent symptoms for a more detailed exam.   *As far as cranberry juice, that will not help you at all. There have been a few studies on that and all it will do is raise your blood sugar and make you gain weight. The amount of substance from the cranberries would require nearly 2 gallons of juice daily, which is not healthy at all. I know that is disappointing, but it is a common myth that it will help.  Urinary tract infections can be prevented by drinking plenty of water to keep your body hydrated.  Also be sure when you wipe, wipe from front to back and don't hold it in!  If possible, empty your bladder every 4 hours.  Your e-visit answers were reviewed by a board certified advanced clinical practitioner to complete your personal care plan.  Depending on the condition, your plan could have included both over the counter or prescription medications.  If there is a problem please reply  once you have received a response from your provider.  Your safety is  important to [place].  If you have drug allergies check your prescription carefully.    You can use MyChart to ask questions about today's visit, request a non-urgent call back, or ask for a work or school excuse for 24 hours related to this e-Visit. If it has been greater than 24 hours you will need to follow up with your provider, or enter a new e-Visit to address those concerns.   You will get an e-mail in the next two days asking about your experience.  I hope that your e-visit has been valuable and will speed your recovery. Thank you for using e-visits.

## 2017-02-23 ENCOUNTER — Telehealth: Payer: Managed Care, Other (non HMO) | Admitting: Nurse Practitioner

## 2017-02-23 DIAGNOSIS — J0100 Acute maxillary sinusitis, unspecified: Secondary | ICD-10-CM

## 2017-02-23 MED ORDER — DOXYCYCLINE HYCLATE 100 MG PO TABS: 100.0000 mg | ORAL_TABLET | Freq: Two times a day (BID) | ORAL | 0 refills | Status: DC

## 2017-02-23 NOTE — Progress Notes (Signed)

## 2017-03-25 ENCOUNTER — Telehealth: Payer: Managed Care, Other (non HMO) | Admitting: Family

## 2017-03-25 DIAGNOSIS — R059 Cough, unspecified: Secondary | ICD-10-CM

## 2017-03-25 DIAGNOSIS — R05 Cough: Secondary | ICD-10-CM

## 2017-03-25 DIAGNOSIS — J069 Acute upper respiratory infection, unspecified: Secondary | ICD-10-CM

## 2017-03-25 MED ORDER — BENZONATATE 100 MG PO CAPS: 100.0000 mg | ORAL_CAPSULE | Freq: Three times a day (TID) | ORAL | 0 refills | Status: DC | PRN

## 2017-03-25 NOTE — Progress Notes (Signed)
We are sorry that you are not feeling well.  Here is how we plan to help!  Based on what you have shared with me it looks like you have upper respiratory tract inflammation that has resulted in a significant cough.  Inflammation and infection in the upper respiratory tract is commonly called bronchitis and has four common causes:  Allergies, Viral Infections, Acid Reflux and Bacterial Infections.  Allergies, viruses and acid reflux are treated by controlling symptoms or eliminating the cause. An example might be a cough caused by taking certain blood pressure medications. You stop the cough by changing the medication. Another example might be a cough caused by acid reflux. Controlling the reflux helps control the cough.  Based on your presentation I believe you most likely have A cough due to a virus.  This is called viral bronchitis and is best treated by rest, plenty of fluids and control of the cough.  You may use Ibuprofen or Tylenol as directed to help your symptoms.     In addition you may use A non-prescription cough medication called Robitussin DAC. Take 2 teaspoons every 8 hours or Delsym: take 2 teaspoons every 12 hours., A non-prescription cough medication called Mucinex DM: take 2 tablets every 12 hours. and A prescription cough medication called Tessalon Perles 100mg. You may take 1-2 capsules every 8 hours as needed for your cough.   USE OF BRONCHODILATOR ("RESCUE") INHALERS: There is a risk from using your bronchodilator too frequently.  The risk is that over-reliance on a medication which only relaxes the muscles surrounding the breathing tubes can reduce the effectiveness of medications prescribed to reduce swelling and congestion of the tubes themselves.  Although you feel brief relief from the bronchodilator inhaler, your asthma may actually be worsening with the tubes becoming more swollen and filled with mucus.  This can delay other crucial treatments, such as oral steroid medications.  If you need to use a bronchodilator inhaler daily, several times per day, you should discuss this with your provider.  There are probably better treatments that could be used to keep your asthma under control.     HOME CARE . Only take medications as instructed by your medical team. . Complete the entire course of an antibiotic. . Drink plenty of fluids and get plenty of rest. . Avoid close contacts especially the very young and the elderly . Cover your mouth if you cough or cough into your sleeve. . Always remember to wash your hands . A steam or ultrasonic humidifier can help congestion.   GET HELP RIGHT AWAY IF: . You develop worsening fever. . You become short of breath . You cough up blood. . Your symptoms persist after you have completed your treatment plan MAKE SURE YOU   Understand these instructions.  Will watch your condition.  Will get help right away if you are not doing well or get worse.  Your e-visit answers were reviewed by a board certified advanced clinical practitioner to complete your personal care plan.  Depending on the condition, your plan could have included both over the counter or prescription medications. If there is a problem please reply  once you have received a response from your provider. Your safety is important to us.  If you have drug allergies check your prescription carefully.    You can use MyChart to ask questions about today's visit, request a non-urgent call back, or ask for a work or school excuse for 24 hours related to this e-Visit.   If it has been greater than 24 hours you will need to follow up with your provider, or enter a new e-Visit to address those concerns. You will get an e-mail in the next two days asking about your experience.  I hope that your e-visit has been valuable and will speed your recovery. Thank you for using e-visits.   

## 2017-04-09 ENCOUNTER — Emergency Department (HOSPITAL_COMMUNITY): Payer: Managed Care, Other (non HMO)

## 2017-04-09 ENCOUNTER — Emergency Department (HOSPITAL_COMMUNITY)
Admission: EM | Admit: 2017-04-09 | Discharge: 2017-04-09 | Disposition: A | Discharge status: Home / Self Care | Payer: Managed Care, Other (non HMO) | Attending: Emergency Medicine | Admitting: Emergency Medicine

## 2017-04-09 ENCOUNTER — Encounter (HOSPITAL_COMMUNITY): Payer: Self-pay

## 2017-04-09 DIAGNOSIS — Z91040 Latex allergy status: Secondary | ICD-10-CM | POA: Insufficient documentation

## 2017-04-09 DIAGNOSIS — Z87442 Personal history of urinary calculi: Secondary | ICD-10-CM | POA: Diagnosis not present

## 2017-04-09 DIAGNOSIS — N1339 Other hydronephrosis: Secondary | ICD-10-CM | POA: Diagnosis not present

## 2017-04-09 DIAGNOSIS — R109 Unspecified abdominal pain: Secondary | ICD-10-CM

## 2017-04-09 LAB — URINALYSIS, ROUTINE W REFLEX MICROSCOPIC
Bilirubin Urine: NEGATIVE
KETONES UR: 5 mg/dL — AB
Leukocytes, UA: NEGATIVE
Nitrite: NEGATIVE
PH: 5 (ref 5.0–8.0)
Protein, ur: 100 mg/dL — AB
SPECIFIC GRAVITY, URINE: 1.025 (ref 1.005–1.030)

## 2017-04-09 LAB — PREGNANCY, URINE: Preg Test, Ur: NEGATIVE

## 2017-04-09 LAB — CBC WITH DIFFERENTIAL/PLATELET
Basophils Absolute: 0 10*3/uL (ref 0.0–0.1)
Basophils Relative: 0 %
Eosinophils Absolute: 0 10*3/uL (ref 0.0–0.7)
Eosinophils Relative: 0 %
HEMATOCRIT: 40.3 % (ref 36.0–46.0)
HEMOGLOBIN: 13.2 g/dL (ref 12.0–15.0)
LYMPHS ABS: 1.3 10*3/uL (ref 0.7–4.0)
Lymphocytes Relative: 7 %
MCH: 28.9 pg (ref 26.0–34.0)
MCHC: 32.8 g/dL (ref 30.0–36.0)
MCV: 88.2 fL (ref 78.0–100.0)
MONOS PCT: 2 %
Monocytes Absolute: 0.5 10*3/uL (ref 0.1–1.0)
NEUTROS ABS: 17.8 10*3/uL — AB (ref 1.7–7.7)
NEUTROS PCT: 91 %
Platelets: 557 10*3/uL — ABNORMAL HIGH (ref 150–400)
RBC: 4.57 MIL/uL (ref 3.87–5.11)
RDW: 13.9 % (ref 11.5–15.5)
WBC: 19.5 10*3/uL — AB (ref 4.0–10.5)

## 2017-04-09 LAB — BASIC METABOLIC PANEL
Anion gap: 10 (ref 5–15)
BUN: 16 mg/dL (ref 6–20)
CHLORIDE: 103 mmol/L (ref 101–111)
CO2: 22 mmol/L (ref 22–32)
CREATININE: 0.85 mg/dL (ref 0.44–1.00)
Calcium: 8.9 mg/dL (ref 8.9–10.3)
GFR calc Af Amer: >60 mL/min (ref >60)
GFR calc non Af Amer: >60 mL/min (ref >60)
Glucose, Bld: 139 mg/dL — ABNORMAL HIGH (ref 65–99)
POTASSIUM: 4.4 mmol/L (ref 3.5–5.1)
Sodium: 135 mmol/L (ref 135–145)

## 2017-04-09 MED ORDER — HYDROMORPHONE HCL 1 MG/ML IJ SOLN
1.0000 mg | Freq: Once | INTRAMUSCULAR | Status: DC
  Filled 2017-04-09: qty 1

## 2017-04-09 MED ORDER — OXYCODONE-ACETAMINOPHEN 5-325 MG PO TABS: 1.0000 | ORAL_TABLET | Freq: Once | ORAL | Status: DC

## 2017-04-09 MED ORDER — ONDANSETRON 4 MG PO TBDP
ORAL_TABLET | ORAL | Status: AC
  Filled 2017-04-09: qty 1

## 2017-04-09 MED ORDER — ONDANSETRON 4 MG PO TBDP: 4.0000 mg | ORAL_TABLET | Freq: Three times a day (TID) | ORAL | 0 refills | Status: DC | PRN

## 2017-04-09 MED ORDER — OXYCODONE-ACETAMINOPHEN 5-325 MG PO TABS
1.0000 | ORAL_TABLET | Freq: Once | ORAL | Status: AC
  Administered 2017-04-09: 1 via ORAL
  Filled 2017-04-09: qty 1

## 2017-04-09 MED ORDER — OXYCODONE-ACETAMINOPHEN 5-325 MG PO TABS
ORAL_TABLET | ORAL | Status: AC
  Filled 2017-04-09: qty 1

## 2017-04-09 MED ORDER — OXYCODONE-ACETAMINOPHEN 5-325 MG PO TABS
1.0000 | ORAL_TABLET | Freq: Once | ORAL | Status: AC
  Administered 2017-04-09: 1 via ORAL

## 2017-04-09 MED ORDER — MORPHINE SULFATE (PF) 4 MG/ML IV SOLN
4.0000 mg | Freq: Once | INTRAVENOUS | Status: AC
  Administered 2017-04-09: 4 mg via INTRAVENOUS
  Filled 2017-04-09: qty 1

## 2017-04-09 MED ORDER — KETOROLAC TROMETHAMINE 30 MG/ML IJ SOLN
30.0000 mg | Freq: Once | INTRAMUSCULAR | Status: AC
  Administered 2017-04-09: 30 mg via INTRAVENOUS
  Filled 2017-04-09: qty 1

## 2017-04-09 MED ORDER — SODIUM CHLORIDE 0.9 % IV BOLUS (SEPSIS)
1000.0000 mL | Freq: Once | INTRAVENOUS | Status: AC
  Administered 2017-04-09: 1000 mL via INTRAVENOUS

## 2017-04-09 MED ORDER — DEXTROSE 5 % IV SOLN
1.0000 g | Freq: Once | INTRAVENOUS | Status: AC
  Administered 2017-04-09: 1 g via INTRAVENOUS
  Filled 2017-04-09: qty 10

## 2017-04-09 MED ORDER — ONDANSETRON HCL 4 MG/2ML IJ SOLN
4.0000 mg | Freq: Once | INTRAMUSCULAR | Status: AC
  Administered 2017-04-09: 4 mg via INTRAVENOUS
  Filled 2017-04-09: qty 2

## 2017-04-09 MED ORDER — OXYCODONE-ACETAMINOPHEN 5-325 MG PO TABS: 1.0000 | ORAL_TABLET | Freq: Four times a day (QID) | ORAL | 0 refills | Status: DC | PRN

## 2017-04-09 MED ORDER — CEPHALEXIN 500 MG PO CAPS: 500.0000 mg | ORAL_CAPSULE | Freq: Three times a day (TID) | ORAL | 0 refills | Status: DC

## 2017-04-09 MED ORDER — HYDROMORPHONE HCL 1 MG/ML IJ SOLN
1.0000 mg | Freq: Once | INTRAMUSCULAR | Status: AC
  Administered 2017-04-09: 1 mg via INTRAVENOUS

## 2017-04-09 MED ORDER — ONDANSETRON 4 MG PO TBDP
4.0000 mg | ORAL_TABLET | Freq: Once | ORAL | Status: AC
  Administered 2017-04-09: 4 mg via ORAL

## 2017-04-09 NOTE — ED Triage Notes (Signed)
Pt complaining of R flank pain x 1 hour. Pt states hx of kidney stones, states feels same. Pt complaining of 2 episodes of emesis today. Pt states recently dx'd with pneumonia, finishing abx for same.

## 2017-04-09 NOTE — Discharge Instructions (Signed)
You were seen today for right-sided flank pain. You are being treated for presumed kidney stone. Given your urinary symptoms and her white count, you will also be given antibiotics for urinary tract infection.  Urine culture has been sent. If you develop any new or worsening symptoms including fevers, nausea, vomiting, worsening pain.

## 2017-04-09 NOTE — ED Notes (Signed)
Pt tearful and tremulous d/t pain. Pt redosed with meds per MD order

## 2017-04-09 NOTE — ED Notes (Signed)
Pt refusing pain medication, states took hydrocodone PTA, no relief.

## 2017-04-09 NOTE — ED Notes (Addendum)
Delay in lab draw,  Pt not in room 

## 2017-04-09 NOTE — ED Provider Notes (Signed)
[facility]-EMERGENCY DEPT Provider Note   CSN: [id number] Arrival date & time: 04/09/17  0038  By signing my name below, I, Mackenzie Gomez, attest that this documentation has been prepared under the direction and in the presence of Mackenzie Gomez. Electronically Signed: Deland PrettySherilynn Gomez, ED Scribe. 04/09/17. 3:35 AM.  History   Chief Complaint Chief Complaint  Patient presents with  . Flank Pain   The history is provided by the patient. No language interpreter was used.   HPI Comments: Mackenzie Gomez is a 36 y.o. female who presents to the Emergency Department complaining of 10/10 "burning" right flank pain with associated nausea and episodes of emesis (2x) that began 2 hours ago today. Onset was acute and has been constant. She states that her pain "wraps around her." Her pain is exacerbated when she breathes. The pt has a PMHx of nephrolithiasis that felt similar to the onset of her current symptoms. The pt also has a h/x of UTI and recent pneumonia. Denies fevers. Patient reports dysuria but no hematuria.  Past Medical History:  Diagnosis Date  . Kidney stone   . Mental disorder   . Urinary tract infection     Patient Active Problem List   Diagnosis Date Noted  . Anemia 07/09/2014    Past Surgical History:  Procedure Laterality Date  . NO PAST SURGERIES      OB History    Gravida Para Term Preterm AB Living   2 2 2     2    SAB TAB Ectopic Multiple Live Births           1       Home Medications    Prior to Admission medications   Medication Sig Start Date End Date Taking? Authorizing Provider  benzonatate (TESSALON PERLES) 100 MG capsule Take 1 capsule (100 mg total) by mouth 3 (three) times daily as needed. 03/25/17   Mackenzie Gomez  cephALEXin (KEFLEX) 500 MG capsule Take 1 capsule (500 mg total) by mouth 3 (three) times daily. 04/09/17   Mackenzie Gomez  ciprofloxacin (CIPRO) 500 MG tablet Take 1 tablet (500 mg total) by mouth 2 (two) times daily.  02/02/17   Mackenzie Gomez  doxycycline (VIBRA-TABS) 100 MG tablet Take 1 tablet (100 mg total) by mouth 2 (two) times daily. 1 po bid 02/23/17   Mackenzie Gomez  ibuprofen (ADVIL,MOTRIN) 200 MG tablet Take 800 mg by mouth every 6 (six) hours as needed for moderate pain.    Provider, Historical, Gomez  levonorgestrel (MIRENA) 20 MCG/24HR IUD 1 each by Intrauterine route once.    Provider, Historical, Gomez  metroNIDAZOLE (FLAGYL) 500 MG tablet Take 1 tablet (500 mg total) by mouth 2 (two) times daily. 10/09/15   Mackenzie Gomez  nitrofurantoin, macrocrystal-monohydrate, (MACROBID) 100 MG capsule Take 1 capsule (100 mg total) by mouth 2 (two) times daily. 01/21/17   Mackenzie Gomez  ondansetron (ZOFRAN ODT) 4 MG disintegrating tablet Take 1 tablet (4 mg total) by mouth every 8 (eight) hours as needed for nausea or vomiting. 04/09/17   Mackenzie Gomez  ondansetron (ZOFRAN) 4 MG tablet Take 1 tablet (4 mg total) by mouth every 6 (six) hours. 10/09/15   Mackenzie Gomez  oxyCODONE-acetaminophen (PERCOCET/ROXICET) 5-325 MG tablet Take 1-2 tablets by mouth every 6 (six) hours as needed for severe pain. 04/09/17   Mackenzie Gomez    Family History Family History  Problem Relation Age of Onset  .  Diabetes Father   . Heart disease Father   . Hyperlipidemia Father   . Cancer Maternal Grandmother   . Diabetes Paternal Grandfather     Social History Social History  Substance Use Topics  . Smoking status: Never Smoker  . Smokeless tobacco: Never Used  . Alcohol use No     Allergies   Penicillins and Latex   Review of Systems Review of Systems  Constitutional: Negative for fever.  Respiratory: Negative for shortness of breath.   Cardiovascular: Negative for chest pain.  Gastrointestinal: Positive for nausea and vomiting. Negative for abdominal pain.  Genitourinary: Positive for dysuria and flank pain. Negative for hematuria.  All other systems reviewed and  are negative.    Physical Exam Updated Vital Signs BP 114/80   Pulse 69   Temp 98.3 F (36.8 C) (Oral)   Resp 18   LMP 04/09/2016   SpO2 98%   Physical Exam  Constitutional: She is oriented to person, place, and time. She appears well-developed and well-nourished.  Uncomfortable appearing, no acute distress  HENT:  Head: Normocephalic and atraumatic.  Cardiovascular: Normal rate and regular rhythm.   Pulmonary/Chest: Effort normal. No respiratory distress.  Abdominal: Soft. Bowel sounds are normal. There is tenderness. There is no guarding.  Minimal tenderness to palpation right lower quadrant  Genitourinary:  Genitourinary Comments: Right CVA tenderness  Neurological: She is alert and oriented to person, place, and time.  Skin: Skin is warm and dry.  Psychiatric: She has a normal mood and affect.  Nursing note and vitals reviewed.    ED Treatments / Results   DIAGNOSTIC STUDIES: Oxygen Saturation is 100% on RA, normal by my interpretation.   COORDINATION OF CARE: 3:30 AM-Discussed next steps with pt. Pt verbalized understanding and is agreeable with the plan.   Labs (all labs ordered are listed, but only abnormal results are displayed) Labs Reviewed  URINALYSIS, ROUTINE W REFLEX MICROSCOPIC - Abnormal; Notable for the following:       Result Value   APPearance HAZY (*)    Glucose, UA >=500 (*)    Hgb urine dipstick MODERATE (*)    Ketones, ur 5 (*)    Protein, ur 100 (*)    Bacteria, UA RARE (*)    Squamous Epithelial / LPF 6-30 (*)    All other components within normal limits  CBC WITH DIFFERENTIAL/PLATELET - Abnormal; Notable for the following:    WBC 19.5 (*)    Platelets 557 (*)    Neutro Abs 17.8 (*)    All other components within normal limits  BASIC METABOLIC PANEL - Abnormal; Notable for the following:    Glucose, Bld 139 (*)    All other components within normal limits  URINE CULTURE  PREGNANCY, URINE    EKG  EKG Interpretation None         Radiology Dg Abdomen 1 View  Result Date: 04/09/2017 CLINICAL DATA:  RIGHT flank pain tonight, nausea, vomiting and diarrhea. History kidney stones. EXAM: ABDOMEN - 1 VIEW COMPARISON:  None. FINDINGS: Paucity of bowel gas. No radio-opaque calculi or other significant radiographic abnormality are seen. Faint calcifications projecting RIGHT upper quadrant most consistent with anterior rib. Phleboliths in the pelvis. IMPRESSION: Nonspecific bowel gas pattern. No radiographic findings of nephrolithiasis though CT is more sensitive. Electronically Signed   By: Awilda Metro M.D.   On: 04/09/2017 05:36   US Renal  Result Date: 04/09/2017 CLINICAL DATA:  RIGHT flank pain.  History of kidney stones. EXAM: RENAL /  URINARY TRACT ULTRASOUND COMPLETE COMPARISON:  CT abdomen and pelvis October 09, 2015 FINDINGS: Right Kidney: Length: 11.2 cm. Echogenicity within normal limits. Moderate RIGHT hydronephrosis. Left Kidney: Length: 12 cm. Echogenicity within normal limits. No mass or hydronephrosis visualized. Bladder: Decompressed. IMPRESSION: Moderate RIGHT hydronephrosis. Electronically Signed   By: Awilda Metro M.D.   On: 04/09/2017 06:09    Procedures Procedures (including critical care time)  Medications Ordered in ED Medications  ondansetron (ZOFRAN-ODT) 4 MG disintegrating tablet (not administered)  oxyCODONE-acetaminophen (PERCOCET/ROXICET) 5-325 MG per tablet (not administered)  oxyCODONE-acetaminophen (PERCOCET/ROXICET) 5-325 MG per tablet 1 tablet (not administered)  oxyCODONE-acetaminophen (PERCOCET/ROXICET) 5-325 MG per tablet 1 tablet (1 tablet Oral Given 04/09/17 0130)  ondansetron (ZOFRAN-ODT) disintegrating tablet 4 mg (4 mg Oral Given 04/09/17 0130)  sodium chloride 0.9 % bolus 1,000 mL (0 mLs Intravenous Stopped 04/09/17 0639)  morphine 4 MG/ML injection 4 mg (4 mg Intravenous Given 04/09/17 0433)  ondansetron (ZOFRAN) injection 4 mg (4 mg Intravenous Given 04/09/17 0432)   HYDROmorphone (DILAUDID) injection 1 mg (1 mg Intravenous Given 04/09/17 0458)  cefTRIAXone (ROCEPHIN) 1 g in dextrose 5 % 50 mL IVPB (1 g Intravenous New Bag/Given 04/09/17 1610)  ketorolac (TORADOL) 30 MG/ML injection 30 mg (30 mg Intravenous Given 04/09/17 9604)     Initial Impression / Assessment and Plan / ED Course  I have reviewed the triage vital signs and the nursing notes.  Pertinent labs & imaging results that were available during my care of the patient were reviewed by me and considered in my medical decision making (see chart for details).     Patient presents with right flank and abdominal pain which is consistent with prior kidney stones. She is uncomfortable appearing but nontoxic. Question will tenderness on exam which would not fit kidney stone picture; however, doubt acute appendicitis given time course. Lab work obtained. Patient is at the significant leukocytosis. No significant evidence of urinary tract infection.  KUB negative. Ultrasound does show right hydronephrosis.  6:30 AM Patient is comfortable. Reports significant improvement with pain and nausea medication. Repeat examination, patient is nontender. Given hydronephrosis on ultrasound, suspect kidney stone given that the nature of the pain is similar to her prior stones. I discussed this with the patient. She is comfortable with treating for kidney stones. She does report dysuria but no fevers. For this reason, urine culture was sent. There is no obvious urinary tract infection but given her symptoms, will treat with Keflex.  Final Clinical Impressions(s) / ED Diagnoses   Final diagnoses:  Flank pain  History of kidney stones  Other hydronephrosis    New Prescriptions New Prescriptions   CEPHALEXIN (KEFLEX) 500 MG CAPSULE    Take 1 capsule (500 mg total) by mouth 3 (three) times daily.   ONDANSETRON (ZOFRAN ODT) 4 MG DISINTEGRATING TABLET    Take 1 tablet (4 mg total) by mouth every 8 (eight) hours as needed  for nausea or vomiting.   OXYCODONE-ACETAMINOPHEN (PERCOCET/ROXICET) 5-325 MG TABLET    Take 1-2 tablets by mouth every 6 (six) hours as needed for severe pain.   I personally performed the services described in this documentation, which was scribed in my presence. The recorded information has been reviewed and is accurate.     Shon Baton, Gomez 04/09/17 [phone]

## 2017-04-09 NOTE — ED Notes (Signed)
Patient continues to have intense pain, asking for additional pain medication.  This RN will prioritize patient for pain management.

## 2017-04-10 LAB — URINE CULTURE: Culture: <10000 — AB

## 2017-11-20 ENCOUNTER — Telehealth: Payer: Managed Care, Other (non HMO) | Admitting: Family

## 2017-11-20 DIAGNOSIS — R197 Diarrhea, unspecified: Secondary | ICD-10-CM

## 2017-11-20 NOTE — Progress Notes (Signed)
Based on what you shared with me it looks like you have a condition that should be evaluated in a face to face office visit.  NOTE: If you entered your credit card information for this eVisit, you will not be charged. You may see a "hold" on your card for the $30 but that hold will drop off and you will not have a charge processed.  If you are having a true medical emergency please call 911.  If you need an urgent face to face visit, [facility] has four urgent care centers for your convenience.  If you need care fast and have a high deductible or no insurance consider:   [email or web address] to reserve your spot online an avoid wait times  [facility] [address], [address] [place], [place] [address] 8 am to 8 pm Monday-Friday 10 am to 4 pm Saturday-Sunday *Across the street from [employer]  [facility]  [address] [place], [address] 8 am to 5 pm Monday-Friday * In the [facility]   The following sites will take your  insurance:  . [facility]  [phone] Get Driving Directions Find a Provider at this Location  [address] [place], [place] [address] . 10 am to 8 pm Monday-Friday . 12 pm to 8 pm Saturday-Sunday   . [facility]  [phone] Get Driving Directions Find a Provider at this Location  [address], [address] [place], [place] [address] . 8 am to 8 pm Monday-Friday . 9 am to 6 pm Saturday . 11 am to 6 pm Sunday   . [facility] [place]  [phone] Get Driving Directions  [address] [place], [place] [address] . 8 am to 8 pm Monday-Friday . 8 am to 4 pm Saturday-Sunday   Your e-visit answers were reviewed by a board certified advanced clinical practitioner to complete your personal care plan.  Thank you for using e-Visits. 

## 2017-12-19 ENCOUNTER — Encounter: Payer: Self-pay | Admitting: Family Medicine

## 2017-12-19 ENCOUNTER — Ambulatory Visit (INDEPENDENT_AMBULATORY_CARE_PROVIDER_SITE_OTHER): Payer: Self-pay | Admitting: Family Medicine

## 2017-12-19 VITALS — BP 114/66 | HR 84 | Temp 97.9°F | Resp 16 | Ht 62.0 in | Wt 105.4 lb

## 2017-12-19 DIAGNOSIS — R5383 Other fatigue: Secondary | ICD-10-CM

## 2017-12-19 DIAGNOSIS — N920 Excessive and frequent menstruation with regular cycle: Secondary | ICD-10-CM

## 2017-12-19 DIAGNOSIS — D509 Iron deficiency anemia, unspecified: Secondary | ICD-10-CM

## 2017-12-19 DIAGNOSIS — Z131 Encounter for screening for diabetes mellitus: Secondary | ICD-10-CM

## 2017-12-19 DIAGNOSIS — F331 Major depressive disorder, recurrent, moderate: Secondary | ICD-10-CM

## 2017-12-19 DIAGNOSIS — G4700 Insomnia, unspecified: Secondary | ICD-10-CM

## 2017-12-19 LAB — POCT URINALYSIS DIP (DEVICE)
BILIRUBIN URINE: NEGATIVE
Glucose, UA: NEGATIVE mg/dL
KETONES UR: NEGATIVE mg/dL
Nitrite: NEGATIVE
Protein, ur: NEGATIVE mg/dL
Urobilinogen, UA: 0.2 mg/dL (ref 0.0–1.0)
pH: 5.5 (ref 5.0–8.0)

## 2017-12-19 LAB — POCT URINE PREGNANCY: Preg Test, Ur: NEGATIVE

## 2017-12-19 LAB — POCT GLYCOSYLATED HEMOGLOBIN (HGB A1C): Hemoglobin A1C: 5.2

## 2017-12-19 MED ORDER — ESCITALOPRAM OXALATE 20 MG PO TABS: 20.0000 mg | ORAL_TABLET | Freq: Every day | ORAL | 6 refills | Status: DC

## 2017-12-19 MED ORDER — TRAZODONE HCL 100 MG PO TABS: 50.0000 mg | ORAL_TABLET | Freq: Every evening | ORAL | 6 refills | Status: DC | PRN

## 2017-12-19 NOTE — Progress Notes (Signed)
Patient ID: Mackenzie Gomez, female    DOB: 05/19/1981, 37 y.o.   MRN: [id number]  PCP: Bing NeighborsHarris, Aiken Withem S, FNP  Chief Complaint  Patient presents with  . Establish Care  . Insomnia  . Anemia    Subjective:  HPI Mackenzie Gomez is a 37 y.o. female with asthma, depressive disorder, menorrhagia, presents to establish care and evaluation of insomnia and anemia. Mackenzie Gomez reports a long history of depression since high school. Previously received medication and psychiatric counseling for which she reports poor compliance. At some point, a provider has diagnosed her with Bipolar disorder. Symptoms were mildly manageable without medication until her son was born in 2014 and she developed postpartum depression. Of recent, she has taken Lexapro consistently since November 2018 and notes improvement of symptoms although feels medication has peaked at effectiveness. She has previously trial and failed Prozac and other medications although uncertain of names of medication. Denies active suicidal ideations or homicidal ideations. Major concern is insomnia. reports a surge in wakefulness and energy occuring nightly around 9:00 pm nightly. She has attempted to combat these symptoms with taking OTC sleep aids and drinking wine to induce sleep without success. Reports that she is very productive at night and gets lots of things completed between the hours of 9:00 pm -2:00 am. Persistent fatigued during the day and has difficulty arousing once asleep. Social History   Socioeconomic History  . Marital status: Married    Spouse name: Not on file  . Number of children: Not on file  . Years of education: Not on file  . Highest education level: Not on file  Social Needs  . Financial resource strain: Not on file  . Food insecurity - worry: Not on file  . Food insecurity - inability: Not on file  . Transportation needs - medical: Not on file  . Transportation needs - non-medical: Not on file  Occupational History  . Not on  file  Tobacco Use  . Smoking status: Never Smoker  . Smokeless tobacco: Never Used  Substance and Sexual Activity  . Alcohol use: No  . Drug use: No  . Sexual activity: Yes  Other Topics Concern  . Not on file  Social History Narrative  . Not on file    Family History  Problem Relation Age of Onset  . Diabetes Father   . Heart disease Father   . Hyperlipidemia Father   . Cancer Maternal Grandmother   . Diabetes Paternal Grandfather    Review of Systems Pertinent negatives listed in HPI Patient Active Problem List   Diagnosis Date Noted  . Anemia 07/09/2014    Allergies  Allergen Reactions  . Ferumoxytol Shortness Of Breath    Nausea, vomiting, and chest pain Nausea, vomiting, and chest pain   . Latex Hives, Rash, Itching and Other (See Comments)    unknown   . Iron Dextran   . Penicillins Other (See Comments)    Unknown--childhood reaction    Prior to Admission medications   Medication Sig Start Date End Date Taking? Authorizing Provider  escitalopram (LEXAPRO) 10 MG tablet Take 10 mg by mouth daily. 04/17/15  Yes [provider]  montelukast (SINGULAIR) 10 MG tablet Take 10 mg by mouth daily. 07/25/17  Yes [provider]  benzonatate (TESSALON PERLES) 100 MG capsule Take 1 capsule (100 mg total) by mouth 3 (three) times daily as needed. Patient not taking: Reported on 12/19/2017 03/25/17   Junie SpencerHawks, Christy A, FNP  levonorgestrel (MIRENA) 20 MCG/24HR IUD  1 each by Intrauterine route once.    [provider]  nitrofurantoin, macrocrystal-monohydrate, (MACROBID) 100 MG capsule Take 1 capsule (100 mg total) by mouth 2 (two) times daily. Patient not taking: Reported on 12/19/2017 01/21/17   Withrow, Everardo All, FNP  ondansetron (ZOFRAN ODT) 4 MG disintegrating tablet Take 1 tablet (4 mg total) by mouth every 8 (eight) hours as needed for nausea or vomiting. Patient not taking: Reported on 12/19/2017 04/09/17   Horton, Mayer Masker, MD  ondansetron (ZOFRAN) 4  MG tablet Take 1 tablet (4 mg total) by mouth every 6 (six) hours. Patient not taking: Reported on 12/19/2017 10/09/15   Glynn Octave, MD  oxyCODONE-acetaminophen (PERCOCET/ROXICET) 5-325 MG tablet Take 1-2 tablets by mouth every 6 (six) hours as needed for severe pain. Patient not taking: Reported on 12/19/2017 04/09/17   Horton, Mayer Masker, MD    Past Medical, Surgical Family and Social History reviewed and updated.    Objective:   Today's Vitals   12/19/17 0848  BP: 114/66  Pulse: 84  Resp: 16  Temp: 97.9 F (36.6 C)  TempSrc: Oral  SpO2: 100%  Weight: 105 lb 6.4 oz (47.8 kg)  Height: 5\' 2"  (1.575 m)    Wt Readings from Last 3 Encounters:  12/19/17 105 lb 6.4 oz (47.8 kg)  10/08/15 95 lb (43.1 kg)  07/09/14 94 lb (42.6 kg)   Depression screen PHQ 2/9 12/19/2017  Decreased Interest 3  Down, Depressed, Hopeless 2  PHQ - 2 Score 5  Altered sleeping 3  Tired, decreased energy 3  Change in appetite 2  Feeling bad or failure about yourself  2  Trouble concentrating 3  Moving slowly or fidgety/restless 0  Suicidal thoughts 2  PHQ-9 Score 20   Physical Exam Physical Exam: Constitutional: Patient appears well-developed and well-nourished. No distress. HENT: Normocephalic, atraumatic, External right and left ear normal. Oropharynx is clear and moist.  Eyes: Conjunctivae and EOM are normal. PERRLA, no scleral icterus. Neck: Normal ROM. Neck supple. No JVD. No tracheal deviation. No thyromegaly. CVS: RRR, S1/S2 +, no murmurs, no gallops, no carotid bruit.  Pulmonary: Effort and breath sounds normal, no stridor, rhonchi, wheezes, rales.  Abdominal: Soft. BS +, no distension, tenderness, rebound or guarding.  Musculoskeletal: Normal range of motion. No edema and no tenderness.  Lymphadenopathy: No lymphadenopathy noted, cervical, inguinal or axillary Neuro: Alert. Normal reflexes, muscle tone coordination. No cranial nerve deficit. Skin: Skin is warm and dry. No rash noted.  Not diaphoretic. No erythema. No pallor. Psychiatric: Normal mood and flat  affect. Behavior, judgment, thought content normal.   Assessment & Plan:  1. Moderate episode of recurrent major depressive disorder (HCC), currently active. Suspect patient has untreated underlying bipolar disorder.  She is currently uninsured therefore I will refer her to [facility] behavioral health services for further evaluation and treatment.  However today I increase her current dose of Lexapro to 20 mg daily and will add trazodone 50 mg at bedtime.   2. Insomnia, unspecified type, on-going, likely secondary to unmanaged/treated bipolar disorder. Patient experiences mania-like symptoms at bedtime. Will check TSH and trial a course of trazodone 50 mg with the option of increasing to 100 mg if needed to achieve sleep.   3. Iron deficiency anemia, unspecified iron deficiency anemia type, patient complains of a history of iron deficiency anemia.  Will check an iron panel along with the CBC today.  4. Other fatigue, suspect secondary to mental health disorder.  However will check an iron panel  as well as a vitamin D level.  5. Spotting, hx of recent ablation. Urine pregnancy negative. Advised to watch wait for now. If bleeding increases in volume or clots form, follow-up with gynecologist.  We will check a CBC to evaluate for low hemoglobin.  6. Screening for diabetes mellitus.  A1c today 5.2 which is normal recheck in 12 months.   Meds ordered this encounter  Medications  . escitalopram (LEXAPRO) 20 MG tablet    Sig: Take 1 tablet (20 mg total) by mouth daily.    Dispense:  30 tablet    Refill:  6    Order Specific Question:   Supervising Provider    Answer:   Quentin Angst [id number]  . traZODone (DESYREL) 100 MG tablet    Sig: Take 0.5-1 tablets (50-100 mg total) by mouth at bedtime as needed for sleep.    Dispense:  30 tablet    Refill:  6    Order Specific Question:   Supervising Provider    Answer:    Quentin Angst [id number]    Orders Placed This Encounter  Procedures  . Thyroid Panel With TSH  . Comprehensive metabolic panel  . CBC with Differential  . Iron, TIBC and Ferritin Panel  . VITAMIN D 25 Hydroxy (Vit-D Deficiency, Fractures)  . POCT glycosylated hemoglobin (Hb A1C)  . POCT urinalysis dip (device)  . POCT urine pregnancy   RTC: 6  weeks for evaluation of treatment.   A total of 60 minutes spent, greater than 50 % of this time was spent reviewing prior medical history, reviewing medications and indications of treatment, prior labs and diagnostic tests, discussing current plan of treatment, health promotion, and goals of treatment.  Godfrey Pick. Tiburcio Pea, MSN, FNP-C [facility]  [address] [place], [place] [address] [phone]

## 2017-12-19 NOTE — Patient Instructions (Addendum)
Start trazodone 50-100 mg 1-2 hours prior to bedtime to induce sleep.  I have increased your lexpro 20 mg once daily at bedtime.  Nice meeting you and I will see you back in 6 weeks!    Please contact  [facility] [place], [place] [phone], to inquire about mental health resources.   Insomnia Insomnia is a sleep disorder that makes it difficult to fall asleep or to stay asleep. Insomnia can cause tiredness (fatigue), low energy, difficulty concentrating, mood swings, and poor performance at work or school. There are three different ways to classify insomnia:  Difficulty falling asleep.  Difficulty staying asleep.  Waking up too early in the morning.  Any type of insomnia can be long-term (chronic) or short-term (acute). Both are common. Short-term insomnia usually lasts for three months or less. Chronic insomnia occurs at least three times a week for longer than three months. What are the causes? Insomnia may be caused by another condition, situation, or substance, such as:  Anxiety.  Certain medicines.  Gastroesophageal reflux disease (GERD) or other gastrointestinal conditions.  Asthma or other breathing conditions.  Restless legs syndrome, sleep apnea, or other sleep disorders.  Chronic pain.  Menopause. This may include hot flashes.  Stroke.  Abuse of alcohol, tobacco, or illegal drugs.  Depression.  Caffeine.  Neurological disorders, such as Alzheimer disease.  An overactive thyroid (hyperthyroidism).  The cause of insomnia may not be known. What increases the risk? Risk factors for insomnia include:  Gender. Women are more commonly affected than men.  Age. Insomnia is more common as you get older.  Stress. This may involve your professional or personal life.  Income. Insomnia is more common in people with lower income.  Lack of exercise.  Irregular work schedule or night shifts.  Traveling between different  time zones.  What are the signs or symptoms? If you have insomnia, trouble falling asleep or trouble staying asleep is the main symptom. This may lead to other symptoms, such as:  Feeling fatigued.  Feeling nervous about going to sleep.  Not feeling rested in the morning.  Having trouble concentrating.  Feeling irritable, anxious, or depressed.  How is this treated? Treatment for insomnia depends on the cause. If your insomnia is caused by an underlying condition, treatment will focus on addressing the condition. Treatment may also include:  Medicines to help you sleep.  Counseling or therapy.  Lifestyle adjustments.  Follow these instructions at home:  Take medicines only as directed by your health care provider.  Keep regular sleeping and waking hours. Avoid naps.  Keep a sleep diary to help you and your health care provider figure out what could be causing your insomnia. Include: ? When you sleep. ? When you wake up during the night. ? How well you sleep. ? How rested you feel the next day. ? Any side effects of medicines you are taking. ? What you eat and drink.  Make your bedroom a comfortable place where it is easy to fall asleep: ? Put up shades or special blackout curtains to block light from outside. ? Use a white noise machine to block noise. ? Keep the temperature cool.  Exercise regularly as directed by your health care provider. Avoid exercising right before bedtime.  Use relaxation techniques to manage stress. Ask your health care provider to suggest some techniques that may work well for you. These may include: ? Breathing exercises. ? Routines to release muscle tension. ? Visualizing peaceful scenes.  Cut back on alcohol, caffeinated beverages, and cigarettes, especially close to bedtime. These can disrupt your sleep.  Do not overeat or eat spicy foods right before bedtime. This can lead to digestive discomfort that can make it hard for you to  sleep.  Limit screen use before bedtime. This includes: ? Watching TV. ? Using your smartphone, tablet, and computer.  Stick to a routine. This can help you fall asleep faster. Try to do a quiet activity, brush your teeth, and go to bed at the same time each night.  Get out of bed if you are still awake after 15 minutes of trying to sleep. Keep the lights down, but try reading or doing a quiet activity. When you feel sleepy, go back to bed.  Make sure that you drive carefully. Avoid driving if you feel very sleepy.  Keep all follow-up appointments as directed by your health care provider. This is important. Contact a health care provider if:  You are tired throughout the day or have trouble in your daily routine due to sleepiness.  You continue to have sleep problems or your sleep problems get worse. Get help right away if:  You have serious thoughts about hurting yourself or someone else. This information is not intended to replace advice given to you by your health care provider. Make sure you discuss any questions you have with your health care provider. Document Released: 09/29/2000 Document Revised: 03/03/2016 Document Reviewed: 07/03/2014 Elsevier Interactive Patient Education  2018 [employer].      Bipolar 2 Disorder Bipolar 2 disorder is a mental health disorder in which a person has episodes of emotional highs (mania) and lows (depression). Bipolar 2 is different from other bipolar disorders because the manic episodes are not as high and do not last as long. This is called hypomania. People with bipolar 2 disorder usually go back and forth between hypomanic and depressive episodes. What are the causes? The cause of this condition is not known. What increases the risk? The following factors may make you more likely to develop this condition:  Having a family member with the disorder.  An imbalance of certain chemicals in the brain (neurotransmitters).  Stress, such  as a death, illness, or financial problems.  Certain conditions that affect the brain or spinal cord (neurologic conditions).  Brain injury (trauma).  Having another mental health disorder, such as: ? Obsessive compulsive disorder. ? Schizophrenia.  What are the signs or symptoms? Symptoms of hypomania include:  Very high self-esteem or self-confidence.  Decreased need for sleep.  Unusual talkativeness or feeling a need to keep talking. Speech may be very fast. It may seem like you cannot stop talking.  Racing thoughts or constant talking, with quick shifts between topics that may or may not be related (flight of ideas).  Decreased ability to focus or concentrate.  Increased purposeful activity, such as work, studies, or social activity.  Increased nonproductive activity. This could be pacing, squirming and fidgeting, or finger and toe tapping.  Impulsive behavior and poor judgment. This may result in high-risk activities, such as having unprotected sex or spending a lot of money.  Symptoms of depression include:  Feeling sad, hopeless, or helpless.  Frequent or uncontrollable crying.  Lack of feeling or caring about anything.  Sleeping too much.  Moving more slowly than usual.  Not being able to enjoy things you used to enjoy.  Desire to be alone all the time.  Feeling guilty or worthless.  Lack of energy or  motivation.  Trouble concentrating or remembering.  Trouble making decisions.  Increased appetite.  Thoughts of death or desire to harm yourself.  How is this diagnosed? To diagnose bipolar 2 disorder, your health care provider may ask about your:  Emotional episodes.  Medical history.  Alcohol and drug use. This includes prescription medicines. Certain medical conditions and substances can cause symptoms that seem like bipolar disorder (secondary bipolar disorder).  How is this treated? Bipolar 2 disorder is a long-term (chronic) illness. It is  best controlled with ongoing (continuous) treatment rather than being treated only when symptoms occur. Treatment may include:  Psychotherapy. Some forms of talk therapy, such as cognitive-behavioral therapy (CBT), can provide support, education, and guidance.  Coping strategies, such as journaling or relaxation exercises. Relaxation exercises include: ? Yoga. ? Meditation. ? Deep breathing.  Lifestyle changes, such as: ? Limiting alcohol and drug use. ? Exercising regularly. ? Getting plenty of sleep. ? Making healthy eating choices.  Medicine. Medicine can be prescribed by a health care provider who specializes in treating mental disorders (psychiatrist). ? Medicines called mood stabilizers are usually prescribed. ? If symptoms occur even while taking a mood stabilizer, other medicines may be added.  A combination of medicine, talk therapy, and coping methods is the best way to treat this condition. Follow these instructions at home: Activity  Return to your normal activities as told by your health care provider.  Find activities that you enjoy, and make time to do them.  Exercise regularly as told by your health care provider. Lifestyle  Limit alcohol intake to no more than 1 drink a day for nonpregnant women and 2 drinks a day for men. One drink equals 12 oz of beer, 5 oz of wine, or 1 oz of hard liquor.  Follow a set schedule for eating and sleeping.  Eat a balanced diet that includes fresh fruits and vegetables, whole grains, low-fat dairy, and lean meats.  Get at least 7-8 hours of sleep each night. General instructions  Take over-the-counter and prescription medicines only as told by your health care provider.  Think about joining a support group. Your health care provider may be able to recommend a support group.  Talk with your family and loved ones about your treatment goals and how they can help.  Keep all follow-up visits as told by your health care  provider. This is important. Where to find more information: For more information about bipolar 2 disorder, visit the following websites:  The [employer] Alliance on Mental Illness: www.nami.org  U.[employer]. [employer] Institute of Mental Health: http://www.maynard.net/www.nimh.nih.gov  Contact a health care provider if:  Your symptoms get worse.  You have side effects from your medicine, and they get worse.  You have trouble sleeping.  You have trouble doing daily activities.  You feel unsafe in your surroundings.  You are dealing with substance abuse. Get help right away if:  You have new symptoms.  You have thoughts about harming yourself or others.  You harm yourself. Summary  Bipolar 2 disorder is a mental health disorder in which a person has episodes of hypomania and depression.  Bipolar 2 is best treated through a combination of medicines, talk therapy, and coping strategies.  Talk with your family and loved ones about your treatment goals and how they can help. This information is not intended to replace advice given to you by your health care provider. Make sure you discuss any questions you have with your health care provider. Document Released: 11/07/2016 Document  Revised: 11/07/2016 Document Reviewed: 11/07/2016 Elsevier Interactive Patient Education  2018 [employer].       Major Depressive Disorder, Adult Major depressive disorder (MDD) is a mental health condition. MDD often makes you feel sad, hopeless, or helpless. MDD can also cause symptoms in your body. MDD can affect your:  Work.  School.  Relationships.  Other normal activities.  MDD can range from mild to very bad. It may occur once (single episode MDD). It can also occur many times (recurrent MDD). The main symptoms of MDD often include:  Feeling sad, depressed, or irritable most of the time.  Loss of interest.  MDD symptoms also include:  Sleeping too much or too little.  Eating too much or too little.  A  change in your weight.  Feeling tired (fatigue) or having low energy.  Feeling worthless.  Feeling guilty.  Trouble making decisions.  Trouble thinking clearly.  Thoughts of suicide or harming others.  Feeling weak.  Feeling agitated.  Keeping yourself from being around other people (isolation).  Follow these instructions at home: Activity  Do these things as told by your doctor: ? Go back to your normal activities. ? Exercise regularly. ? Spend time outdoors. Alcohol  Talk with your doctor about how alcohol can affect your antidepressant medicines.  Do not drink alcohol. Or, limit how much alcohol you drink. ? This means no more than 1 drink a day for nonpregnant women and 2 drinks a day for men. One drink equals one of these:  12 oz of beer.  5 oz of wine.  1 oz of hard liquor. General instructions  Take over-the-counter and prescription medicines only as told by your doctor.  Eat a healthy diet.  Get plenty of sleep.  Find activities that you enjoy. Make time to do them.  Think about joining a support group. Your doctor may be able to suggest a group for you.  Keep all follow-up visits as told by your doctor. This is important. Where to find more information:  The [employer] on Mental Illness: ? www.nami.org  U.[employer] of Mental Health: ? http://www.maynard.net/  National Suicide Prevention Lifeline: ? 8678793702. This is free, 24-hour help. Contact a doctor if:  Your symptoms get worse.  You have new symptoms. Get help right away if:  You self-harm.  You see, hear, taste, smell, or feel things that are not present (hallucinate). If you ever feel like you may hurt yourself or others, or have thoughts about taking your own life, get help right away. You can go to your nearest emergency department or call:  Your local emergency services (911 in the [place]  A suicide crisis helpline, such as the National Suicide Prevention  Lifeline: ? 854-241-7191. This is open 24 hours a day.  This information is not intended to replace advice given to you by your health care provider. Make sure you discuss any questions you have with your health care provider. Document Released: 09/13/2015 Document Revised: 06/18/2016 Document Reviewed: 06/18/2016 Elsevier Interactive Patient Education  2017 [employer].

## 2017-12-20 LAB — CBC WITH DIFFERENTIAL/PLATELET
BASOS ABS: 0 10*3/uL (ref 0.0–0.2)
Basos: 0 %
EOS (ABSOLUTE): 0.4 10*3/uL (ref 0.0–0.4)
EOS: 5 %
HEMATOCRIT: 42.1 % (ref 34.0–46.6)
HEMOGLOBIN: 14 g/dL (ref 11.1–15.9)
Immature Grans (Abs): 0 10*3/uL (ref 0.0–0.1)
Immature Granulocytes: 0 %
LYMPHS ABS: 2.9 10*3/uL (ref 0.7–3.1)
Lymphs: 38 %
MCH: 29.5 pg (ref 26.6–33.0)
MCHC: 33.3 g/dL (ref 31.5–35.7)
MCV: 89 fL (ref 79–97)
MONOCYTES: 8 %
Monocytes Absolute: 0.6 10*3/uL (ref 0.1–0.9)
Neutrophils Absolute: 3.6 10*3/uL (ref 1.4–7.0)
Neutrophils: 49 %
Platelets: 431 10*3/uL — ABNORMAL HIGH (ref 150–379)
RBC: 4.75 x10E6/uL (ref 3.77–5.28)
RDW: 13.7 % (ref 12.3–15.4)
WBC: 7.5 10*3/uL (ref 3.4–10.8)

## 2017-12-20 LAB — COMPREHENSIVE METABOLIC PANEL
ALBUMIN: 4.7 g/dL (ref 3.5–5.5)
ALT: 22 IU/L (ref 0–32)
AST: 27 IU/L (ref 0–40)
Albumin/Globulin Ratio: 1.6 (ref 1.2–2.2)
Alkaline Phosphatase: 86 IU/L (ref 39–117)
BILIRUBIN TOTAL: 0.3 mg/dL (ref 0.0–1.2)
BUN / CREAT RATIO: 17 (ref 9–23)
BUN: 12 mg/dL (ref 6–20)
CHLORIDE: 102 mmol/L (ref 96–106)
CO2: 23 mmol/L (ref 20–29)
Calcium: 10 mg/dL (ref 8.7–10.2)
Creatinine, Ser: 0.7 mg/dL (ref 0.57–1.00)
GFR calc non Af Amer: 112 mL/min/{1.73_m2} (ref >59)
GFR, EST AFRICAN AMERICAN: 129 mL/min/{1.73_m2} (ref >59)
GLOBULIN, TOTAL: 2.9 g/dL (ref 1.5–4.5)
GLUCOSE: 78 mg/dL (ref 65–99)
Potassium: 4.3 mmol/L (ref 3.5–5.2)
SODIUM: 141 mmol/L (ref 134–144)
TOTAL PROTEIN: 7.6 g/dL (ref 6.0–8.5)

## 2017-12-20 LAB — THYROID PANEL WITH TSH
Free Thyroxine Index: 1.7 (ref 1.2–4.9)
T3 Uptake Ratio: 25 % (ref 24–39)
T4, Total: 6.8 ug/dL (ref 4.5–12.0)
TSH: 2.49 u[IU]/mL (ref 0.450–4.500)

## 2017-12-20 LAB — IRON,TIBC AND FERRITIN PANEL
FERRITIN: 33 ng/mL (ref 15–150)
Iron Saturation: 29 % (ref 15–55)
Iron: 85 ug/dL (ref 27–159)
Total Iron Binding Capacity: 289 ug/dL (ref 250–450)
UIBC: 204 ug/dL (ref 131–425)

## 2017-12-20 LAB — VITAMIN D 25 HYDROXY (VIT D DEFICIENCY, FRACTURES): VIT D 25 HYDROXY: 37.7 ng/mL (ref 30.0–100.0)

## 2017-12-25 ENCOUNTER — Encounter: Payer: Self-pay | Admitting: Family Medicine

## 2017-12-25 ENCOUNTER — Telehealth: Payer: Self-pay | Admitting: Family Medicine

## 2017-12-25 NOTE — Telephone Encounter (Signed)
See prior message

## 2017-12-25 NOTE — Telephone Encounter (Signed)
Contact patient to advise all of her recent labs were within expected range and her iron level is normal. Her platelets are slightly elevated which increases the thickness of her circulating blood cell. For this treatment is taking one 81 mg aspirin per day. If the medication for insomnia, trazodone  is not working alone she can take the entire tablet which is  100 mg. It takes time for medication to be effective that's the purpose of scheduling a 6 week follow-up to evaluate the effectiveness of medication. She can also take diphenhydramine (benadryl) 25-50 mg with the trazodone if increased sedation is needed.    Mackenzie PickKimberly S. Tiburcio PeaHarris, MSN, FNP-C [facility]  [address] [place], [place] [address] [phone]

## 2017-12-26 ENCOUNTER — Telehealth: Payer: Self-pay

## 2017-12-26 NOTE — Telephone Encounter (Signed)
Called, no answer. Left a message for patient to call back regarding labs and left callback number. Thanks!

## 2017-12-26 NOTE — Telephone Encounter (Signed)
Patient notified and stated that she has been doing the 100mg  and that has not been working. Patient will come in for a office visit sooner to discuss.

## 2017-12-26 NOTE — Telephone Encounter (Signed)
Patient notified

## 2018-01-08 ENCOUNTER — Ambulatory Visit: Payer: Self-pay | Admitting: Nurse Practitioner

## 2018-01-08 ENCOUNTER — Encounter: Payer: Self-pay | Admitting: Nurse Practitioner

## 2018-01-08 VITALS — BP 98/60 | HR 70 | Temp 98.4°F | Resp 16 | Wt 106.0 lb

## 2018-01-08 DIAGNOSIS — H6692 Otitis media, unspecified, left ear: Secondary | ICD-10-CM

## 2018-01-08 MED ORDER — CEFDINIR 300 MG PO CAPS: 300.0000 mg | ORAL_CAPSULE | Freq: Two times a day (BID) | ORAL | 0 refills | Status: AC

## 2018-01-08 MED ORDER — FLUTICASONE PROPIONATE 50 MCG/ACT NA SUSP: 2.0000 | Freq: Every day | NASAL | 0 refills | Status: DC

## 2018-01-08 NOTE — Patient Instructions (Addendum)

## 2018-01-08 NOTE — Progress Notes (Signed)
Subjective:    Patient ID: Mackenzie Gomez, female    DOB: 05/28/1981, 37 y.o.   MRN: [id number]  Otalgia   There is pain in the left ear. This is a new problem. The current episode started yesterday. The problem occurs constantly. The problem has been gradually worsening. Maximum temperature: suspected but not measured. The fever has been present for 1 to 2 days. The pain is at a severity of 8/10. The pain is moderate. Associated symptoms include coughing, headaches, rhinorrhea and a sore throat. Pertinent negatives include no abdominal pain, ear discharge, hearing loss, rash or vomiting. She has tried NSAIDs for the symptoms. The treatment provided mild relief. chronic ear infection and recurrent sinusitis      Review of Systems  Constitutional: Positive for appetite change, chills, fatigue and fever.  HENT: Positive for ear pain, postnasal drip, rhinorrhea and sore throat. Negative for ear discharge, hearing loss, sinus pressure and sinus pain.   Eyes: Negative.   Respiratory: Positive for cough.        Producing yellow sputum  Cardiovascular: Negative.   Gastrointestinal: Negative for abdominal pain and vomiting.       Decreased appetite  Skin: Negative.  Negative for rash.  Allergic/Immunologic: Negative.   Neurological: Positive for headaches. Negative for dizziness, tremors, seizures, syncope, speech difficulty and weakness.       Objective:   Physical Exam  Constitutional: She is oriented to person, place, and time. She appears well-developed and well-nourished. She appears distressed.  Due to ear pain, rates 8/10  HENT:  Head: Normocephalic and atraumatic.  Right Ear: External ear normal.  TM erythematous and bulging in left ear, tragus tender to manipulation. No sinus pressure or sinus tenderness. + post nasal drip  Eyes: Pupils are equal, round, and reactive to light. Conjunctivae and EOM are normal.  Neck: Normal range of motion. Neck supple. No tracheal deviation present.  No thyromegaly present.  Cardiovascular: Normal rate, regular rhythm and normal heart sounds.  Pulmonary/Chest: Effort normal and breath sounds normal. No respiratory distress. She has no wheezes.  Abdominal: Soft. Bowel sounds are normal. She exhibits no distension. There is no tenderness.  Neurological: She is alert and oriented to person, place, and time. No cranial nerve deficit.  Skin: Skin is warm and dry. No rash noted. No erythema.  Psychiatric: She has a normal mood and affect. Her behavior is normal. Judgment and thought content normal.          Assessment & Plan:  Left Otitis Media 1. Cefdinir 300mg  2 times daily for 10 days.  Warm compresses to left ear.  Take Tylenol for pain, fever or general discomfort as directed. Follow up if unable to tolerated abx.   2. Use Fluticasone Nasal spray, 2 sprays in each nostril daily for 10 days.  May continue if symptoms persist longer.   3. Work note provided. 4.  Follow up for worsening pain or other concerns.  Patient verbalizes understanding and patient had no questions at time of discharge.  Meds ordered this encounter  Medications  . cefdinir (OMNICEF) 300 MG capsule    Sig: Take 1 capsule (300 mg total) by mouth 2 (two) times daily for 10 days.    Dispense:  20 capsule    Refill:  0    Order Specific Question:   Supervising Provider    Answer:   Stacie GlazeJENKINS, JOHN E [[id number]]  . fluticasone (FLONASE) 50 MCG/ACT nasal spray    Sig: Place 2 sprays into both  nostrils daily for 10 days.    Dispense:  16 g    Refill:  0    Order Specific Question:   Supervising Provider    Answer:   Stacie Glaze [phone]

## 2018-01-10 ENCOUNTER — Telehealth: Payer: Self-pay | Admitting: Emergency Medicine

## 2018-01-10 NOTE — Telephone Encounter (Signed)
Left vm for pt to return call for follow up since her visit

## 2018-01-10 NOTE — Telephone Encounter (Signed)
Patient returned call and stated she is still not feeling well. Her throat is stll bothering her, after speaking with the provider Lorene Dyhristie, I notified patient to continue antibiotic, to gargle with warm salt water, and use 800mg  motrin every 8 hours and if she is not better by Monday to come back in for a follow visit, she stated she understood and that she would

## 2018-01-11 ENCOUNTER — Ambulatory Visit (INDEPENDENT_AMBULATORY_CARE_PROVIDER_SITE_OTHER): Payer: Self-pay | Admitting: Family Medicine

## 2018-01-11 ENCOUNTER — Encounter: Payer: Self-pay | Admitting: Family Medicine

## 2018-01-11 VITALS — BP 96/68 | HR 75 | Temp 98.2°F | Ht 62.0 in | Wt 108.0 lb

## 2018-01-11 DIAGNOSIS — F331 Major depressive disorder, recurrent, moderate: Secondary | ICD-10-CM

## 2018-01-11 DIAGNOSIS — R059 Cough, unspecified: Secondary | ICD-10-CM

## 2018-01-11 DIAGNOSIS — R05 Cough: Secondary | ICD-10-CM

## 2018-01-11 DIAGNOSIS — G4700 Insomnia, unspecified: Secondary | ICD-10-CM

## 2018-01-11 MED ORDER — GUAIFENESIN-CODEINE 100-10 MG/5ML PO SYRP: 5.0000 mL | ORAL_SOLUTION | Freq: Three times a day (TID) | ORAL | 0 refills | Status: DC | PRN

## 2018-01-11 MED ORDER — HYDROXYZINE PAMOATE 50 MG PO CAPS
50.0000 mg | ORAL_CAPSULE | Freq: Every day | ORAL | 0 refills | Status: DC
Start: 2018-01-11 — End: 2018-02-08

## 2018-01-11 NOTE — Progress Notes (Signed)
Patient ID: Mackenzie Gomez, female    DOB: Feb 05, 1981, 37 y.o.   MRN: [id number]  PCP: Bing Neighbors, FNP  Chief Complaint  Patient presents with  . Follow-up    Depression    Subjective:  HPI Mackenzie Gomez is a 37 y.o. female with major depression, anxiety, and insomnia, presents for medication follow-up. Mackenzie Gomez was seen in clinic on 12/19/2017 and evaluated for depression and insomnia. She has chronically taken Lexapro 10 mg for several months and dose was increased to 20 mg. She reports this change has improved mood. She was also prescribed Trazodone for sleep and reports that 50 mg doesn't induce sleep and 100 mg causes hang-over effect the following day. She is interest in trying a different medication for insomnia. She was previously taking multiple doses of Unisom to induce sleep. Denies suicidal ideations, homicidal ideations, or auditory hallucinations. Social History   Socioeconomic History  . Marital status: Married    Spouse name: Not on file  . Number of children: Not on file  . Years of education: Not on file  . Highest education level: Not on file  Occupational History  . Not on file  Social Needs  . Financial resource strain: Not on file  . Food insecurity:    Worry: Not on file    Inability: Not on file  . Transportation needs:    Medical: Not on file    Non-medical: Not on file  Tobacco Use  . Smoking status: Never Smoker  . Smokeless tobacco: Never Used  Substance and Sexual Activity  . Alcohol use: No  . Drug use: No  . Sexual activity: Yes  Lifestyle  . Physical activity:    Days per week: Not on file    Minutes per session: Not on file  . Stress: Not on file  Relationships  . Social connections:    Talks on phone: Not on file    Gets together: Not on file    Attends religious service: Not on file    Active member of club or organization: Not on file    Attends meetings of clubs or organizations: Not on file    Relationship status: Not on file  .  Intimate partner violence:    Fear of current or ex partner: Not on file    Emotionally abused: Not on file    Physically abused: Not on file    Forced sexual activity: Not on file  Other Topics Concern  . Not on file  Social History Narrative  . Not on file    Family History  Problem Relation Age of Onset  . Diabetes Father   . Heart disease Father   . Hyperlipidemia Father   . Cancer Maternal Grandmother   . Diabetes Paternal Grandfather    Review of Systems Pertinent negative listed in HPI  Patient Active Problem List   Diagnosis Date Noted  . Anemia 07/09/2014    Allergies  Allergen Reactions  . Ferumoxytol Shortness Of Breath    Nausea, vomiting, and chest pain Nausea, vomiting, and chest pain   . Latex Hives, Rash, Itching and Other (See Comments)    unknown   . Iron Dextran   . Penicillins Other (See Comments)    Unknown--childhood reaction    Prior to Admission medications   Medication Sig Start Date End Date Taking? Authorizing Provider  cefdinir (OMNICEF) 300 MG capsule Take 1 capsule (300 mg total) by mouth 2 (two) times daily for 10 days. 01/08/18 01/18/18  Yes Benay PikeLeath, Christie Janell, NP  Cetirizine HCl (ZYRTEC ALLERGY) 10 MG CAPS Take by mouth.   Yes [provider]  escitalopram (LEXAPRO) 20 MG tablet Take 1 tablet (20 mg total) by mouth daily. 12/19/17  Yes Bing NeighborsHarris, Shellyann Wandrey S, FNP  fluticasone (FLONASE) 50 MCG/ACT nasal spray Place 2 sprays into both nostrils daily for 10 days. 01/08/18 01/18/18 Yes Benay PikeLeath, Christie Janell, NP  montelukast (SINGULAIR) 10 MG tablet Take 10 mg by mouth daily. 07/25/17  Yes [provider]  ibuprofen (ADVIL,MOTRIN) 200 MG tablet Take 200 mg by mouth every 6 (six) hours as needed.    [provider]  traZODone (DESYREL) 100 MG tablet Take 0.5-1 tablets (50-100 mg total) by mouth at bedtime as needed for sleep. Patient not taking: Reported on 01/08/2018 12/19/17   Bing NeighborsHarris, Estefana Taylor S, FNP    Past Medical,  Surgical Family and Social History reviewed and updated.    Objective:   Today's Vitals   01/11/18 1122  BP: 96/68  Pulse: 75  Temp: 98.2 F (36.8 C)  TempSrc: Oral  SpO2: 100%  Weight: 108 lb (49 kg)  Height: 5\' 2"  (1.575 m)    Wt Readings from Last 3 Encounters:  01/11/18 108 lb (49 kg)  01/08/18 106 lb (48.1 kg)  12/19/17 105 lb 6.4 oz (47.8 kg)   Physical Exam Constitutional: Patient appears well-developed and well-nourished. No distress. CVS: RRR, S1/S2 +, no murmurs, no gallops, no carotid bruit.  Pulmonary: Effort and breath sounds normal, no stridor, rhonchi, wheezes, rales. Positive for dry cough. Musculoskeletal: Normal range of motion. No edema and no tenderness.  Skin: Skin is warm and dry. No rash noted. Not diaphoretic. No erythema. No pallor. Psychiatric: Normal mood and affect. Behavior, judgment, thought content normal. Assessment & Plan:  1. Moderate episode of recurrent major depressive disorder (HCC), continue Lexapro 20 mg once daily.  2. Insomnia, unspecified type, continue trazodone 100 mg at bedtime only if needed and will trial a milder sedative hydroxyzine 50-100 mg at bedtime for routine bedtime use. 3. Cough, will trial guaifenesin-codeine 5 ml, 3 times daily as needed for cough.    RTC: as needed    Godfrey PickKimberly S. Tiburcio PeaHarris, MSN, FNP-C [facility]  [address] [place], [place] [address] [phone]

## 2018-01-11 NOTE — Patient Instructions (Signed)
Major Depressive Disorder, Adult Major depressive disorder (MDD) is a mental health condition. MDD often makes you feel sad, hopeless, or helpless. MDD can also cause symptoms in your body. MDD can affect your:  Work.  School.  Relationships.  Other normal activities.  MDD can range from mild to very bad. It may occur once (single episode MDD). It can also occur many times (recurrent MDD). The main symptoms of MDD often include:  Feeling sad, depressed, or irritable most of the time.  Loss of interest.  MDD symptoms also include:  Sleeping too much or too little.  Eating too much or too little.  A change in your weight.  Feeling tired (fatigue) or having low energy.  Feeling worthless.  Feeling guilty.  Trouble making decisions.  Trouble thinking clearly.  Thoughts of suicide or harming others.  Feeling weak.  Feeling agitated.  Keeping yourself from being around other people (isolation).  Follow these instructions at home: Activity  Do these things as told by your doctor: ? Go back to your normal activities. ? Exercise regularly. ? Spend time outdoors. Alcohol  Talk with your doctor about how alcohol can affect your antidepressant medicines.  Do not drink alcohol. Or, limit how much alcohol you drink. ? This means no more than 1 drink a day for nonpregnant women and 2 drinks a day for men. One drink equals one of these:  12 oz of beer.  5 oz of wine.  1 oz of hard liquor. General instructions  Take over-the-counter and prescription medicines only as told by your doctor.  Eat a healthy diet.  Get plenty of sleep.  Find activities that you enjoy. Make time to do them.  Think about joining a support group. Your doctor may be able to suggest a group for you.  Keep all follow-up visits as told by your doctor. This is important. Where to find more information:  The [employer] on Mental Illness: ? www.nami.org  [employer]  of Mental Health: ? http://www.maynard.net/www.nimh.nih.gov  National Suicide Prevention Lifeline: ? 718-259-09661-520 077 0298. This is free, 24-hour help. Contact a doctor if:  Your symptoms get worse.  You have new symptoms. Get help right away if:  You self-harm.  You see, hear, taste, smell, or feel things that are not present (hallucinate). If you ever feel like you may hurt yourself or others, or have thoughts about taking your own life, get help right away. You can go to your nearest emergency department or call:  Your local emergency services (911 in the [place]  A suicide crisis helpline, such as the National Suicide Prevention Lifeline: ? (559)548-46551-520 077 0298. This is open 24 hours a day.  This information is not intended to replace advice given to you by your health care provider. Make sure you discuss any questions you have with your health care provider. Document Released: 09/13/2015 Document Revised: 06/18/2016 Document Reviewed: 06/18/2016 Elsevier Interactive Patient Education  2017 Elsevier Inc.  Cough, Adult A cough helps to clear your throat and lungs. A cough may last only 2-3 weeks (acute), or it may last longer than 8 weeks (chronic). Many different things can cause a cough. A cough may be a sign of an illness or another medical condition. Follow these instructions at home:  Pay attention to any changes in your cough.  Take medicines only as told by your doctor. ? If you were prescribed an antibiotic medicine, take it as told by your doctor. Do not stop taking it even if you start  to feel better. ? Talk with your doctor before you try using a cough medicine.  Drink enough fluid to keep your pee (urine) clear or pale yellow.  If the air is dry, use a cold steam vaporizer or humidifier in your home.  Stay away from things that make you cough at work or at home.  If your cough is worse at night, try using extra pillows to raise your head up higher while you sleep.  Do not smoke, and try not to  be around smoke. If you need help quitting, ask your doctor.  Do not have caffeine.  Do not drink alcohol.  Rest as needed. Contact a doctor if:  You have new problems (symptoms).  You cough up yellow fluid (pus).  Your cough does not get better after 2-3 weeks, or your cough gets worse.  Medicine does not help your cough and you are not sleeping well.  You have pain that gets worse or pain that is not helped with medicine.  You have a fever.  You are losing weight and you do not know why.  You have night sweats. Get help right away if:  You cough up blood.  You have trouble breathing.  Your heartbeat is very fast. This information is not intended to replace advice given to you by your health care provider. Make sure you discuss any questions you have with your health care provider. Document Released: 06/15/2011 Document Revised: 03/09/2016 Document Reviewed: 12/09/2014 Elsevier Interactive Patient Education  [employer].

## 2018-01-21 ENCOUNTER — Ambulatory Visit: Payer: Self-pay | Admitting: Family Medicine

## 2018-01-21 VITALS — BP 110/76 | HR 82 | Temp 99.2°F | Resp 20

## 2018-01-21 DIAGNOSIS — H6690 Otitis media, unspecified, unspecified ear: Secondary | ICD-10-CM

## 2018-01-21 MED ORDER — DOXYCYCLINE HYCLATE 100 MG PO TABS: 100.0000 mg | ORAL_TABLET | Freq: Two times a day (BID) | ORAL | 0 refills | Status: DC

## 2018-01-21 NOTE — Patient Instructions (Signed)
Otitis Media, Adult Otitis media is redness, soreness, and puffiness (swelling) in the space just behind your eardrum (middle ear). It may be caused by allergies or infection. It often happens along with a cold. Follow these instructions at home:  Take your medicine as told. Finish it even if you start to feel better.  Only take over-the-counter or prescription medicines for pain, discomfort, or fever as told by your doctor.  Follow up with your doctor as told. Contact a doctor if:  You have otitis media only in one ear, or bleeding from your nose, or both.  You notice a lump on your neck.  You are not getting better in 3-5 days.  You feel worse instead of better. Get help right away if:  You have pain that is not helped with medicine.  You have puffiness, redness, or pain around your ear.  You get a stiff neck.  You cannot move part of your face (paralysis).  You notice that the bone behind your ear hurts when you touch it. This information is not intended to replace advice given to you by your health care provider. Make sure you discuss any questions you have with your health care provider. Document Released: 03/20/2008 Document Revised: 03/09/2016 Document Reviewed: 04/29/2013 Elsevier Interactive Patient Education  2017 Elsevier Inc.  

## 2018-01-21 NOTE — Progress Notes (Signed)
Mackenzie Gomez is a 37 y.o. female who complains of ear pain. She was treated 3/26 and placed on 10 days of antibiotic treatment but did not feel really any relief. She now has bilateral ear pain. She has been taking motrin for the pain and denies systemic symptoms of fever, loss of hearing or drainage for either ear. Patient reports a history of tubes in her ears and a previous visit in the last 5 years stated tubes may be a option for current symptom management.  Review of Systems  Constitutional: Negative.   HENT: Positive for ear pain.   Eyes: Negative.   Respiratory: Negative.   Cardiovascular: Negative.   Gastrointestinal: Negative.   Genitourinary: Negative.   Musculoskeletal: Negative.   Skin: Negative.   Neurological: Negative.   Endo/Heme/Allergies: Negative.   Psychiatric/Behavioral: Negative.     O: Vitals:   01/21/18 1738  BP: 110/76  Pulse: 82  Resp: 20  Temp: 99.2 F (37.3 C)  SpO2: 98%   Physical Exam  Constitutional: She is oriented to person, place, and time. Vital signs are normal. She appears well-developed and well-nourished.  HENT:  Head: Normocephalic.  Right Ear: Hearing, external ear and ear canal normal. Tympanic membrane is injected, erythematous and bulging. Tympanic membrane mobility is abnormal.  Left Ear: Hearing, external ear and ear canal normal. Tympanic membrane is injected, erythematous and bulging. Tympanic membrane mobility is abnormal.  Nose: Rhinorrhea present.  Mouth/Throat: Uvula is midline and oropharynx is clear and moist.  Eyes: Pupils are equal, round, and reactive to light.  Neck: Normal range of motion. Neck supple.  Cardiovascular: Normal rate, regular rhythm and normal heart sounds.  Pulmonary/Chest: Effort normal and breath sounds normal.  Abdominal: Soft. Bowel sounds are normal.  Musculoskeletal: Normal range of motion.  Lymphadenopathy:    She has no cervical adenopathy.  Neurological: She is alert and oriented to person,  place, and time.  Skin: Skin is warm and dry.    A: 1. Otitis media, unspecified laterality, unspecified otitis media type    P: Advised patient to f/u in clinic to ensure treatment is effective- may consider referral to PCP for Rocephin as next step treatment due to allergies and treatment failure up to this point. This future option if Doxy not effective discussed with patient - PCP will be ultimate authority on treatment and management.  1. Otitis media, unspecified laterality, unspecified otitis media type - doxycycline (VIBRA-TABS) 100 MG tablet; Take 1 tablet (100 mg total) by mouth 2 (two) times daily.

## 2018-01-25 ENCOUNTER — Ambulatory Visit: Payer: No Typology Code available for payment source | Admitting: Family Medicine

## 2018-02-08 ENCOUNTER — Ambulatory Visit (INDEPENDENT_AMBULATORY_CARE_PROVIDER_SITE_OTHER): Payer: Medicaid Other | Admitting: Family Medicine

## 2018-02-08 ENCOUNTER — Encounter: Payer: Self-pay | Admitting: Family Medicine

## 2018-02-08 VITALS — BP 100/70 | HR 79 | Temp 98.0°F | Ht 62.0 in | Wt 105.0 lb

## 2018-02-08 DIAGNOSIS — F319 Bipolar disorder, unspecified: Secondary | ICD-10-CM | POA: Diagnosis not present

## 2018-02-08 MED ORDER — QUETIAPINE FUMARATE 100 MG PO TABS: 100.0000 mg | ORAL_TABLET | Freq: Every day | ORAL | 1 refills | Status: DC

## 2018-02-08 NOTE — Progress Notes (Signed)
Chief Complaint  Patient presents with  . Follow-up    depression     Subjective:     Mackenzie Gomez is a 37 y.o. female who presents for worsening depression  Patient has a history of bipolar depression.  She is currently not being followed by psychiatry.  Patient was started on hydroxyzine during previous visit, but states that medication was ineffective.  Current symptoms of anxiety include constant worrying, racing thoughts, insomnia, and anhedonia.  Patient currently denies suicidal or homicidal ideations.  She does not have a family history of depression.  Patient says that symptoms of depression and anxiety began after her second pregnancy in 2014.  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  Patient has been treated with both Lexapro and fluoxetine, but has failed therapies.  Patient has failed previous therapies due to poor compliance.  Patient states that she has attempted to alleviate insomnia with over-the-counter sleep aids.   Past Medical History:  Diagnosis Date  . Kidney stone   . Mental disorder   . Urinary tract infection    Social History   Socioeconomic History  . Marital status: Married    Spouse name: Not on file  . Number of children: Not on file  . Years of education: Not on file  . Highest education level: Not on file  Occupational History  . Not on file  Social Needs  . Financial resource strain: Not on file  . Food insecurity:    Worry: Not on file    Inability: Not on file  . Transportation needs:    Medical: Not on file    Non-medical: Not on file  Tobacco Use  . Smoking status: Never Smoker  . Smokeless tobacco: Never Used  Substance and Sexual Activity  . Alcohol use: No  . Drug use: No  . Sexual activity: Yes  Lifestyle  . Physical activity:    Days per week: Not on file    Minutes per session: Not on file  . Stress: Not on file  Relationships  . Social connections:    Talks on phone: Not  on file    Gets together: Not on file    Attends religious service: Not on file    Active member of club or organization: Not on file    Attends meetings of clubs or organizations: Not on file    Relationship status: Not on file  . Intimate partner violence:    Fear of current or ex partner: Not on file    Emotionally abused: Not on file    Physically abused: Not on file    Forced sexual activity: Not on file  Other Topics Concern  . Not on file  Social History Narrative  . Not on file   Immunization History  Administered Date(s) Administered  . Influenza-Unspecified 07/30/2014  . Rho (D) Immune Globulin 12/29/2012   Review of Systems  Constitutional: Negative.   HENT: Negative.   Eyes: Negative.   Respiratory: Negative.   Cardiovascular: Negative.   Gastrointestinal: Negative.   Genitourinary: Negative.   Musculoskeletal: Negative.   Skin: Negative.   Neurological: Negative.   Psychiatric/Behavioral: Positive for depression. Negative for substance abuse. The patient is nervous/anxious and has insomnia.      Objective:   Physical Exam  Constitutional: She appears well-developed and well-nourished.  Cardiovascular: Normal rate, regular rhythm and normal heart sounds.  Pulmonary/Chest: Effort normal and breath sounds normal.  Abdominal: Soft. Bowel sounds are  normal.  Psychiatric: Her speech is normal and behavior is normal. Her mood appears anxious. Cognition and memory are normal. She exhibits a depressed mood.   Assessment:   BP 100/70 (BP Location: Left Arm, Patient Position: Sitting, Cuff Size: Small)   Pulse 79   Temp 98 F (36.7 C) (Oral)   Ht 5\' 2"  (1.575 m)   Wt 105 lb (47.6 kg)   SpO2 99%   BMI 19.20 kg/m   Plan:  1. Bipolar depression (HCC) - Ambulatory referral to Psychiatry - QUEtiapine (SEROQUEL) 100 MG tablet; Take 1 tablet (100 mg total) by mouth at bedtime.  Dispense: 30 tablet; Refill: 1  Depression screen [facility] 2/9 02/08/2018 01/11/2018 12/19/2017   Decreased Interest 2 2 3   Down, Depressed, Hopeless 3 2 2   PHQ - 2 Score 5 4 5   Altered sleeping 3 3 3   Tired, decreased energy 2 2 3   Change in appetite 2 2 2   Feeling bad or failure about yourself  0 0 2  Trouble concentrating 1 1 3   Moving slowly or fidgety/restless - 0 0  Suicidal thoughts 2 0 2  PHQ-9 Score 15 12 20    The patient was given clear instructions to go to ER or return to medical center if symptoms do not improve, worsen or new problems develop. The patient verbalized understanding.   Nolon Nations  MSN, FNP-C [facility] [address] [place], [place] [address] [phone]

## 2018-02-08 NOTE — Patient Instructions (Addendum)
Refrain from drinking, driving, or operating machinery while taking this medication.  I have also sent a referral to psychiatry.    Quetiapine tablets What is this medicine? QUETIAPINE (kwe TYE a peen) is an antipsychotic. It is used to treat schizophrenia and bipolar disorder, also known as manic-depression. This medicine may be used for other purposes; ask your health care provider or pharmacist if you have questions. COMMON BRAND NAME(S): Seroquel What should I tell my health care provider before I take this medicine? They need to know if you have any of these conditions: -brain tumor or head injury -breast cancer -cataracts -diabetes -difficulty swallowing -heart disease -kidney disease -liver disease -low blood counts, like low white cell, platelet, or red cell counts -low blood pressure or dizziness when standing up -Parkinson's disease -previous heart attack -seizures -suicidal thoughts, plans, or attempt by you or a family member -thyroid disease -an unusual or allergic reaction to quetiapine, other medicines, foods, dyes, or preservatives -pregnant or trying to get pregnant -breast-feeding How should I use this medicine? Take this medicine by mouth. Swallow it with a drink of water. Follow the directions on the prescription label. If it upsets your stomach you can take it with food. Take your medicine at regular intervals. Do not take it more often than directed. Do not stop taking except on the advice of your doctor or health care professional. A special MedGuide will be given to you by the pharmacist with each prescription and refill. Be sure to read this information carefully each time. Talk to your pediatrician regarding the use of this medicine in children. While this drug may be prescribed for children as young as 10 years for selected conditions, precautions do apply. Patients over age 37 years may have a stronger reaction to this medicine and need smaller  doses. Overdosage: If you think you have taken too much of this medicine contact a poison control center or emergency room at once. NOTE: This medicine is only for you. Do not share this medicine with others. What if I miss a dose? If you miss a dose, take it as soon as you can. If it is almost time for your next dose, take only that dose. Do not take double or extra doses. What may interact with this medicine? Do not take this medicine with any of the following medications: -certain medicines for fungal infections like fluconazole, itraconazole, ketoconazole, posaconazole, voriconazole -cisapride -dofetilide -dronedarone -droperidol -grepafloxacin -halofantrine -phenothiazines like chlorpromazine, mesoridazine, thioridazine -pimozide -sparfloxacin -ziprasidone This medicine may also interact with the following medications: -alcohol -antiviral medicines for HIV or AIDS -certain medicines for blood pressure -certain medicines for depression, anxiety, or psychotic disturbances like haloperidol, lorazepam -certain medicines for diabetes -certain medicines for Parkinson's disease -certain medicines for seizures like carbamazepine, phenobarbital, phenytoin -cimetidine -erythromycin -other medicines that prolong the QT interval (cause an abnormal heart rhythm) -rifampin -steroid medicines like prednisone or cortisone This list may not describe all possible interactions. Give your health care provider a list of all the medicines, herbs, non-prescription drugs, or dietary supplements you use. Also tell them if you smoke, drink alcohol, or use illegal drugs. Some items may interact with your medicine. What should I watch for while using this medicine? Visit your doctor or health care professional for regular checks on your progress. It may be several weeks before you see the full effects of this medicine. Your health care provider may suggest that you have your eyes examined prior to  starting this medicine, and every  6 months thereafter. If you have been taking this medicine regularly for some time, do not suddenly stop taking it. You must gradually reduce the dose or your symptoms may get worse. Ask your doctor or health care professional for advice. Patients and their families should watch out for worsening depression or thoughts of suicide. Also watch out for sudden or severe changes in feelings such as feeling anxious, agitated, panicky, irritable, hostile, aggressive, impulsive, severely restless, overly excited and hyperactive, or not being able to sleep. If this happens, especially at the beginning of antidepressant treatment or after a change in dose, call your health care professional. Bonita Quin may get dizzy or drowsy. Do not drive, use machinery, or do anything that needs mental alertness until you know how this medicine affects you. Do not stand or sit up quickly, especially if you are an older patient. This reduces the risk of dizzy or fainting spells. Alcohol can increase dizziness and drowsiness. Avoid alcoholic drinks. Do not treat yourself for colds, diarrhea or allergies. Ask your doctor or health care professional for advice, some ingredients may increase possible side effects. This medicine can reduce the response of your body to heat or cold. Dress warm in cold weather and stay hydrated in hot weather. If possible, avoid extreme temperatures like saunas, hot tubs, very hot or cold showers, or activities that can cause dehydration such as vigorous exercise. What side effects may I notice from receiving this medicine? Side effects that you should report to your doctor or health care professional as soon as possible: -allergic reactions like skin rash, itching or hives, swelling of the face, lips, or tongue -difficulty swallowing -fast or irregular heartbeat -fever or chills, sore throat -fever with rash, swollen lymph nodes, or swelling of the face -increased hunger or  thirst -increased urination -problems with balance, talking, walking -seizures -stiff muscles -suicidal thoughts or other mood changes -uncontrollable head, mouth, neck, arm, or leg movements -unusually weak or tired Side effects that usually do not require medical attention (report to your doctor or health care professional if they continue or are bothersome): -change in sex drive or performance -constipation -drowsy or dizzy -dry mouth -stomach upset -weight gain This list may not describe all possible side effects. Call your doctor for medical advice about side effects. You may report side effects to FDA at 1-800-FDA-1088. Where should I keep my medicine? Keep out of the reach of children. Store at room temperature between 15 and 30 degrees C (59 and 86 degrees F). Throw away any unused medicine after the expiration date. NOTE: This sheet is a summary. It may not cover all possible information. If you have questions about this medicine, talk to your doctor, pharmacist, or health care provider.  2018 Elsevier/Gold Standard (2015-04-06 13:07:35)

## 2018-02-13 DIAGNOSIS — F319 Bipolar disorder, unspecified: Secondary | ICD-10-CM | POA: Insufficient documentation

## 2018-02-25 ENCOUNTER — Encounter: Payer: Self-pay | Admitting: Family Medicine

## 2018-03-08 ENCOUNTER — Ambulatory Visit (INDEPENDENT_AMBULATORY_CARE_PROVIDER_SITE_OTHER): Payer: Medicaid Other | Admitting: Family Medicine

## 2018-03-08 ENCOUNTER — Encounter: Payer: Self-pay | Admitting: Family Medicine

## 2018-03-08 VITALS — BP 120/80 | HR 96 | Temp 98.2°F | Ht 62.0 in | Wt 107.0 lb

## 2018-03-08 DIAGNOSIS — Z09 Encounter for follow-up examination after completed treatment for conditions other than malignant neoplasm: Secondary | ICD-10-CM

## 2018-03-08 DIAGNOSIS — F319 Bipolar disorder, unspecified: Secondary | ICD-10-CM

## 2018-03-08 DIAGNOSIS — G4700 Insomnia, unspecified: Secondary | ICD-10-CM

## 2018-03-08 MED ORDER — TRAZODONE HCL 50 MG PO TABS: ORAL_TABLET | ORAL | 1 refills | Status: DC

## 2018-03-08 MED ORDER — QUETIAPINE FUMARATE 50 MG PO TABS: 50.0000 mg | ORAL_TABLET | Freq: Every day | ORAL | 1 refills | Status: DC

## 2018-03-08 NOTE — Progress Notes (Signed)
Subjective:     Patient ID: Mackenzie Gomez, female   DOB: [birth year], 37 y.o.   MRN: [id number]  PCP: Raliegh Ip, NP  Chief Complaint  Patient presents with  . Follow-up    depression  . Anxiety    HPI  [name] has history of Bipolar Depression and Anxiety. She is here today for follow up since beginning Seroquel.   Current Status: Patient is currently prescribed Seroquel 100 mg. She states that she currently has been taking half of Seroquel (50 mg) because of the increased sedation effects of the full dosage. She has been initiated on several antidepressant medications, with the most recent one being Atarax, which have all been ineffective. She has occasional headaches. She denies visual changes, agitation, and suicidal ideations.   She denies fevers, chills, recent infections, fatigue, weight loss, and night sweats. She denies chest pain, heart palpitations, cough, and shortness of breath. She has good appetite. Denies abdominal pain, nausea, vomiting, diarrhea, and constipation.   She denies pain today.   Past Medical History:  Diagnosis Date  . Kidney stone   . Mental disorder   . Urinary tract infection     Family History  Problem Relation Age of Onset  . Diabetes Father   . Heart disease Father   . Hyperlipidemia Father   . Cancer Maternal Grandmother   . Diabetes Paternal Grandfather     Social History   Socioeconomic History  . Marital status: Married    Spouse name: Not on file  . Number of children: Not on file  . Years of education: Not on file  . Highest education level: Not on file  Occupational History  . Not on file  Social Needs  . Financial resource strain: Not on file  . Food insecurity:    Worry: Not on file    Inability: Not on file  . Transportation needs:    Medical: Not on file    Non-medical: Not on file  Tobacco Use  . Smoking status: Never Smoker  . Smokeless tobacco: Never Used  Substance and Sexual Activity  . Alcohol use: No   . Drug use: No  . Sexual activity: Yes  Lifestyle  . Physical activity:    Days per week: Not on file    Minutes per session: Not on file  . Stress: Not on file  Relationships  . Social connections:    Talks on phone: Not on file    Gets together: Not on file    Attends religious service: Not on file    Active member of club or organization: Not on file    Attends meetings of clubs or organizations: Not on file    Relationship status: Not on file  . Intimate partner violence:    Fear of current or ex partner: Not on file    Emotionally abused: Not on file    Physically abused: Not on file    Forced sexual activity: Not on file  Other Topics Concern  . Not on file  Social History Narrative  . Not on file    Past Surgical History:  Procedure Laterality Date  . NO PAST SURGERIES     Immunization History  Administered Date(s) Administered  . Influenza-Unspecified 07/30/2014  . Rho (D) Immune Globulin 12/29/2012    Current Meds  Medication Sig  . aspirin 81 MG chewable tablet Chew by mouth daily.  Marland Kitchen escitalopram (LEXAPRO) 20 MG tablet Take 1 tablet (20 mg total) by mouth daily.  Marland Kitchen  ibuprofen (ADVIL,MOTRIN) 200 MG tablet Take 200 mg by mouth every 6 (six) hours as needed.  Marland Kitchen levocetirizine (XYZAL) 2.5 MG/5ML solution Take 2.5 mg by mouth every evening.  . montelukast (SINGULAIR) 10 MG tablet Take 10 mg by mouth daily.  . traZODone (DESYREL) 100 MG tablet Take 100 mg by mouth at bedtime.    Allergies  Allergen Reactions  . Ferumoxytol Shortness Of Breath    Nausea, vomiting, and chest pain Nausea, vomiting, and chest pain   . Latex Hives, Rash, Itching and Other (See Comments)    unknown   . Iron Dextran   . Penicillins Other (See Comments)    Unknown--childhood reaction   BP 120/80 (BP Location: Left Arm, Patient Position: Sitting, Cuff Size: Small)   Pulse 96   Temp 98.2 F (36.8 C) (Oral)   Ht  (1.575 m)   Wt 107 lb (48.5 kg)   LMP 02/21/2018   SpO2  97%   BMI 19.57 kg/m    Review of Systems  Constitutional: Negative.   HENT: Negative.   Eyes: Negative.   Respiratory: Negative.   Cardiovascular: Negative.   Gastrointestinal: Negative.   Endocrine: Negative.   Genitourinary: Negative.   Musculoskeletal: Negative.   Skin: Negative.   Allergic/Immunologic: Negative.   Neurological: Positive for headaches (frequent).  Hematological: Negative.   Psychiatric/Behavioral: Negative.    Objective:   Physical Exam  Constitutional: She is oriented to person, place, and time. She appears well-developed and well-nourished.  HENT:  Head: Normocephalic and atraumatic.  Right Ear: External ear normal.  Left Ear: External ear normal.  Nose: Nose normal.  Mouth/Throat: Oropharynx is clear and moist.  Eyes: Pupils are equal, round, and reactive to light. Conjunctivae and EOM are normal.  Neck: Normal range of motion. Neck supple.  Cardiovascular: Normal rate, regular rhythm, normal heart sounds and intact distal pulses.  Pulmonary/Chest: Effort normal and breath sounds normal.  Abdominal: Soft. Bowel sounds are normal.  Musculoskeletal: Normal range of motion.  Neurological: She is alert and oriented to person, place, and time.  Skin: Skin is warm and dry. Capillary refill takes less than 2 seconds.  Psychiatric: She has a normal mood and affect. Her behavior is normal. Judgment and thought content normal.  Nursing note and vitals reviewed.  Assessment:   1. Bipolar depression (HCC) 2. Insomnia, unspecified type 3. Follow up  Plan:   1. Bipolar depression (HCC) She is tolerating Seroquel 50 mg better than 100 mg, which makes her more sedative the next morning. We will reduce dose to Seroquel 50 mg QHS. Patient agreed to take medication every night to evaluate effectiveness of anxiety/depression.   She has been referred to psychiatry. She has been advised to follow up at [facility], who has walk-in services M-F  8:30-3:30p, for walk-in counseling services. We will re-assess Seroquel effectiveness in 2 months.   2. Insomnia, unspecified type See # 1.   3. Follow up She will follow up in 2 months to evaluate effectiveness of Seroquel.   Meds ordered this encounter  Medications  . traZODone (DESYREL) 50 MG tablet    Sig: Take 1-2 tablets at bedtime.    Dispense:  30 tablet    Refill:  1  . QUEtiapine (SEROQUEL) 50 MG tablet    Sig: Take 1 tablet (50 mg total) by mouth at bedtime.    Dispense:  30 tablet    Refill:  1    Raliegh Ip,  MSN, FNP-BC Patient Care  Center [facility] [address] [place], [place] [address] [phone]

## 2018-04-05 ENCOUNTER — Other Ambulatory Visit: Payer: Self-pay | Admitting: Family Medicine

## 2018-04-05 DIAGNOSIS — F319 Bipolar disorder, unspecified: Secondary | ICD-10-CM

## 2018-05-01 ENCOUNTER — Ambulatory Visit (INDEPENDENT_AMBULATORY_CARE_PROVIDER_SITE_OTHER): Payer: Medicaid Other | Admitting: Family Medicine

## 2018-05-01 ENCOUNTER — Encounter: Payer: Self-pay | Admitting: Family Medicine

## 2018-05-01 VITALS — BP 100/64 | HR 84 | Temp 98.6°F | Ht 62.0 in | Wt 108.0 lb

## 2018-05-01 DIAGNOSIS — Z09 Encounter for follow-up examination after completed treatment for conditions other than malignant neoplasm: Secondary | ICD-10-CM | POA: Diagnosis not present

## 2018-05-01 DIAGNOSIS — F419 Anxiety disorder, unspecified: Secondary | ICD-10-CM | POA: Diagnosis not present

## 2018-05-01 DIAGNOSIS — G4700 Insomnia, unspecified: Secondary | ICD-10-CM | POA: Diagnosis not present

## 2018-05-01 MED ORDER — BUSPIRONE HCL 5 MG PO TABS: 5.0000 mg | ORAL_TABLET | Freq: Two times a day (BID) | ORAL | 2 refills | Status: DC

## 2018-05-01 NOTE — Patient Instructions (Signed)
Buspirone tablets What is this medicine? BUSPIRONE (byoo SPYE rone) is used to treat anxiety disorders. This medicine may be used for other purposes; ask your health care provider or pharmacist if you have questions. COMMON BRAND NAME(S): BuSpar What should I tell my health care provider before I take this medicine? They need to know if you have any of these conditions: -kidney or liver disease -an unusual or allergic reaction to buspirone, other medicines, foods, dyes, or preservatives -pregnant or trying to get pregnant -breast-feeding How should I use this medicine? Take this medicine by mouth with a glass of water. Follow the directions on the prescription label. You may take this medicine with or without food. To ensure that this medicine always works the same way for you, you should take it either always with or always without food. Take your doses at regular intervals. Do not take your medicine more often than directed. Do not stop taking except on the advice of your doctor or health care professional. Talk to your pediatrician regarding the use of this medicine in children. Special care may be needed. Overdosage: If you think you have taken too much of this medicine contact a poison control center or emergency room at once. NOTE: This medicine is only for you. Do not share this medicine with others. What if I miss a dose? If you miss a dose, take it as soon as you can. If it is almost time for your next dose, take only that dose. Do not take double or extra doses. What may interact with this medicine? Do not take this medicine with any of the following medications: -linezolid -MAOIs like Carbex, Eldepryl, Marplan, Nardil, and Parnate -methylene blue -procarbazine This medicine may also interact with the following medications: -diazepam -digoxin -diltiazem -erythromycin -grapefruit juice -haloperidol -medicines for mental depression or mood problems -medicines for seizures like  carbamazepine, phenobarbital and phenytoin -nefazodone -other medications for anxiety -rifampin -ritonavir -some antifungal medicines like itraconazole, ketoconazole, and voriconazole -verapamil -warfarin This list may not describe all possible interactions. Give your health care provider a list of all the medicines, herbs, non-prescription drugs, or dietary supplements you use. Also tell them if you smoke, drink alcohol, or use illegal drugs. Some items may interact with your medicine. What should I watch for while using this medicine? Visit your doctor or health care professional for regular checks on your progress. It may take 1 to 2 weeks before your anxiety gets better. You may get drowsy or dizzy. Do not drive, use machinery, or do anything that needs mental alertness until you know how this drug affects you. Do not stand or sit up quickly, especially if you are an older patient. This reduces the risk of dizzy or fainting spells. Alcohol can make you more drowsy and dizzy. Avoid alcoholic drinks. What side effects may I notice from receiving this medicine? Side effects that you should report to your doctor or health care professional as soon as possible: -blurred vision or other vision changes -chest pain -confusion -difficulty breathing -feelings of hostility or anger -muscle aches and pains -numbness or tingling in hands or feet -ringing in the ears -skin rash and itching -vomiting -weakness Side effects that usually do not require medical attention (report to your doctor or health care professional if they continue or are bothersome): -disturbed dreams, nightmares -headache -nausea -restlessness or nervousness -sore throat and nasal congestion -stomach upset This list may not describe all possible side effects. Call your doctor for medical advice about side   effects. You may report side effects to FDA at 1-800-FDA-1088. Where should I keep my medicine? Keep out of the reach  of children. Store at room temperature below 30 degrees C (86 degrees F). Protect from light. Keep container tightly closed. Throw away any unused medicine after the expiration date. NOTE: This sheet is a summary. It may not cover all possible information. If you have questions about this medicine, talk to your doctor, pharmacist, or health care provider.  2018 Elsevier/Gold Standard (2010-05-12 18:06:11)  

## 2018-05-01 NOTE — Progress Notes (Signed)
Subjective:    Patient ID: Mackenzie Gomez, female    DOB: 1981-05-20, 37 y.o.   MRN: [id number]  PCP: Mackenzie Ip, NP  Chief Complaint  Patient presents with  . Follow-up    Depression    HPI  [name] has a past medical history of Depression. She is here today for follow up. She is accompanied today by her 2 children.   Current Status: Since her last office visit, she is doing well with no complaints. She was initiated on Seroquel and has discontinued because of adverse side effects. She has also discontinued Trazadone because of the lingering drowsiness side effects.   She denies fevers, chills, fatigue, recent infections, weight loss, and night sweats.   She has not had any headaches, visual changes, dizziness, and falls.  No chest pain, heart palpitations, cough and shortness of breath reported.   No reports of GI problems such as nausea, vomiting, diarrhea, and constipation.   She has no reports of blood in stools, dysuria and hematuria.   She states that her anxiety levels have increased. She has not been able to go to [place] because of her children. She plans to restart once her children go back to school.     She denies pain today.   Past Medical History:  Diagnosis Date  . Kidney stone   . Mental disorder   . Urinary tract infection     Family History  Problem Relation Age of Onset  . Diabetes Father   . Heart disease Father   . Hyperlipidemia Father   . Cancer Maternal Grandmother   . Diabetes Paternal Grandfather     Social History   Socioeconomic History  . Marital status: Married    Spouse name: Not on file  . Number of children: Not on file  . Years of education: Not on file  . Highest education level: Not on file  Occupational History  . Not on file  Social Needs  . Financial resource strain: Not on file  . Food insecurity:    Worry: Not on file    Inability: Not on file  . Transportation needs:    Medical: Not on file    Non-medical:  Not on file  Tobacco Use  . Smoking status: Never Smoker  . Smokeless tobacco: Never Used  Substance and Sexual Activity  . Alcohol use: No  . Drug use: No  . Sexual activity: Yes  Lifestyle  . Physical activity:    Days per week: Not on file    Minutes per session: Not on file  . Stress: Not on file  Relationships  . Social connections:    Talks on phone: Not on file    Gets together: Not on file    Attends religious service: Not on file    Active member of club or organization: Not on file    Attends meetings of clubs or organizations: Not on file    Relationship status: Not on file  . Intimate partner violence:    Fear of current or ex partner: Not on file    Emotionally abused: Not on file    Physically abused: Not on file    Forced sexual activity: Not on file  Other Topics Concern  . Not on file  Social History Narrative  . Not on file    Past Surgical History:  Procedure Laterality Date  . NO PAST SURGERIES     Immunization History  Administered Date(s) Administered  . Influenza-Unspecified  07/30/2014  . Rho (D) Immune Globulin 12/29/2012    Current Meds  Medication Sig  . escitalopram (LEXAPRO) 20 MG tablet Take 1 tablet (20 mg total) by mouth daily.  Marland Kitchen. levocetirizine (XYZAL) 2.5 MG/5ML solution Take 2.5 mg by mouth every evening.  . montelukast (SINGULAIR) 10 MG tablet Take 10 mg by mouth daily.  . traZODone (DESYREL) 50 MG tablet Take 1-2 tablets at bedtime.    Allergies  Allergen Reactions  . Ferumoxytol Shortness Of Breath    Nausea, vomiting, and chest pain Nausea, vomiting, and chest pain   . Latex Hives, Rash, Itching and Other (See Comments)    unknown   . Iron Dextran   . Penicillins Other (See Comments)    Unknown--childhood reaction    BP 100/64 (BP Location: Left Arm, Patient Position: Sitting, Cuff Size: Small)   Pulse 84   Temp 98.6 F (37 C) (Oral)   Ht 5\' 2"  (1.575 m)   Wt 108 lb (49 kg)   SpO2 100%   BMI 19.75 kg/m    Review of Systems  Constitutional: Negative.   HENT: Negative.   Eyes: Negative.   Respiratory: Negative.   Cardiovascular: Negative.   Gastrointestinal: Negative.   Endocrine: Negative.   Genitourinary: Negative.   Musculoskeletal: Negative.   Skin:       Multiple tatoos ever body.   Allergic/Immunologic: Negative.   Neurological: Negative.   Hematological: Negative.   Psychiatric/Behavioral: Negative.    Objective:   Physical Exam  Constitutional: She is oriented to person, place, and time. She appears well-developed and well-nourished.  HENT:  Head: Normocephalic and atraumatic.  Right Ear: External ear normal.  Left Ear: External ear normal.  Nose: Nose normal.  Mouth/Throat: Oropharynx is clear and moist.  Eyes: Pupils are equal, round, and reactive to light. Conjunctivae and EOM are normal.  Neck: Normal range of motion. Neck supple.  Cardiovascular: Normal rate, regular rhythm, normal heart sounds and intact distal pulses.  Pulmonary/Chest: Effort normal and breath sounds normal.  Abdominal: Soft. Bowel sounds are normal.  Musculoskeletal: Normal range of motion.  Neurological: She is alert and oriented to person, place, and time.  Skin: Skin is warm and dry. Capillary refill takes less than 2 seconds.  Psychiatric: She has a normal mood and affect. Her behavior is normal. Judgment and thought content normal.  Nursing note and vitals reviewed.  Assessment & Plan:   1. Anxiety We will discontinue Seroquel at this time because of adverse side effects. We will initiate Buspar.  - busPIRone (BUSPAR) 5 MG tablet; Take 1 tablet (5 mg total) by mouth 2 (two) times daily.  Dispense: 60 tablet; Refill: 2  2. Insomnia, unspecified type Stable. Monitor.   3. Follow up She will follow up in 2 months.   Meds ordered this encounter  Medications  . busPIRone (BUSPAR) 5 MG tablet    Sig: Take 1 tablet (5 mg total) by mouth 2 (two) times daily.    Dispense:  60 tablet     Refill:  2   Mackenzie IpNatalie Yamil Dougher,  MSN, FNP-C [facility] [address]  [place], [place] [id number] [phone]

## 2018-05-08 ENCOUNTER — Ambulatory Visit: Payer: No Typology Code available for payment source | Admitting: Family Medicine

## 2018-07-02 ENCOUNTER — Ambulatory Visit (INDEPENDENT_AMBULATORY_CARE_PROVIDER_SITE_OTHER): Payer: Medicaid Other | Admitting: Family Medicine

## 2018-07-02 ENCOUNTER — Encounter: Payer: Self-pay | Admitting: Family Medicine

## 2018-07-02 VITALS — BP 102/70 | HR 64 | Temp 98.2°F | Ht 62.0 in | Wt 112.0 lb

## 2018-07-02 DIAGNOSIS — R319 Hematuria, unspecified: Secondary | ICD-10-CM

## 2018-07-02 DIAGNOSIS — Z09 Encounter for follow-up examination after completed treatment for conditions other than malignant neoplasm: Secondary | ICD-10-CM | POA: Diagnosis not present

## 2018-07-02 DIAGNOSIS — F419 Anxiety disorder, unspecified: Secondary | ICD-10-CM | POA: Diagnosis not present

## 2018-07-02 DIAGNOSIS — N390 Urinary tract infection, site not specified: Secondary | ICD-10-CM | POA: Diagnosis not present

## 2018-07-02 DIAGNOSIS — G4700 Insomnia, unspecified: Secondary | ICD-10-CM

## 2018-07-02 DIAGNOSIS — L299 Pruritus, unspecified: Secondary | ICD-10-CM | POA: Diagnosis not present

## 2018-07-02 LAB — POCT URINALYSIS DIP (MANUAL ENTRY)
Bilirubin, UA: NEGATIVE
Glucose, UA: NEGATIVE mg/dL
Ketones, POC UA: NEGATIVE mg/dL
Nitrite, UA: NEGATIVE
Protein Ur, POC: NEGATIVE mg/dL
Spec Grav, UA: 1.02 (ref 1.010–1.025)
Urobilinogen, UA: 0.2 E.U./dL
pH, UA: 6.5 (ref 5.0–8.0)

## 2018-07-02 MED ORDER — HYDROXYZINE HCL 10 MG PO TABS: 10.0000 mg | ORAL_TABLET | Freq: Three times a day (TID) | ORAL | 3 refills | Status: DC | PRN

## 2018-07-02 MED ORDER — SULFAMETHOXAZOLE-TRIMETHOPRIM 800-160 MG PO TABS: 1.0000 | ORAL_TABLET | Freq: Two times a day (BID) | ORAL | 0 refills | Status: DC

## 2018-07-02 NOTE — Progress Notes (Signed)
Follow Up  Subjective:    Patient ID: Der Swazil[place]Jordan, female    DOB: 07/08/1981, 37 y.o.   MRN: [id number]   Chief Complaint  Patient presents with  . Follow-up    Health maintence   HPI  Ms. Swazil[place]Jordan is a 37 year old female with a past medical history of Mental Disorder and Kidney Stones. She is here today for follow up.   Current Status: Since her last office visit, she is doing well with no complaints. Anxiety is much more manageable since beginning Buspar.   S/p: Ablation 08/2018.   She denies fevers, chills, fatigue, recent infections, weight loss, and night sweats. She has not had any headaches, visual changes, dizziness, and falls. No chest pain, heart palpitations, cough and shortness of breath reported. No reports of GI problems such as nausea, vomiting, diarrhea, and constipation. She has no reports of blood in stools, dysuria and hematuria. No depression or anxiety reported.   Review of Systems  Constitutional: Negative.   HENT: Negative.   Eyes: Negative.   Respiratory: Negative.   Cardiovascular: Negative.   Gastrointestinal: Negative.        Abdominal cramps  Endocrine: Negative.   Genitourinary: Negative.   Musculoskeletal: Negative.   Skin: Negative.   Allergic/Immunologic: Negative.   Neurological: Negative.   Hematological: Negative.   Psychiatric/Behavioral: Positive for sleep disturbance (mostly in weekends).   Objective:   Physical Exam  Constitutional: She is oriented to person, place, and time. She appears well-developed and well-nourished.  HENT:  Head: Normocephalic and atraumatic.  Right Ear: External ear normal.  Left Ear: External ear normal.  Nose: Nose normal.  Mouth/Throat: Oropharynx is clear and moist.  Eyes: Pupils are equal, round, and reactive to light. Conjunctivae and EOM are normal.  Neck: Normal range of motion. Neck supple.  Cardiovascular: Normal rate, regular rhythm, normal heart sounds and intact distal pulses.  Pulmonary/Chest:  Effort normal and breath sounds normal.  Abdominal: Soft. Bowel sounds are normal.  Neurological: She is alert and oriented to person, place, and time.  Skin: Skin is warm and dry. Capillary refill takes less than 2 seconds.  Psychiatric: She has a normal mood and affect. Her behavior is normal. Judgment and thought content normal.  Nursing note and vitals reviewed.  Assessment & Plan:   1. Anxiety Buspar is effective. She will continue as prescribed.    2. Itching We will initiate  Atarax today.  - hydrOXYzine (ATARAX/VISTARIL) 10 MG tablet; Take 1 tablet (10 mg total) by mouth 3 (three) times daily as needed.  Dispense: 90 tablet; Refill: 3  3. Urinary tract infection with hematuria, site unspecified - sulfamethoxazole-trimethoprim (BACTRIM DS,SEPTRA DS) 800-160 MG tablet; Take 1 tablet by mouth 2 (two) times daily.  Dispense: 14 tablet; Refill: 0  4. Insomnia, unspecified type Trazodone is effective.   5. Follow up She will follow up in 3 months.  - POCT urinalysis dipstick  Meds ordered this encounter  Medications  . hydrOXYzine (ATARAX/VISTARIL) 10 MG tablet    Sig: Take 1 tablet (10 mg total) by mouth 3 (three) times daily as needed.    Dispense:  90 tablet    Refill:  3  . sulfamethoxazole-trimethoprim (BACTRIM DS,SEPTRA DS) 800-160 MG tablet    Sig: Take 1 tablet by mouth 2 (two) times daily.    Dispense:  14 tablet    Refill:  0    Raliegh IpNatalie Caroly Purewal,  MSN, FNP-C [facility] [address]  [place], [place] [address] [phone]

## 2018-07-02 NOTE — Patient Instructions (Addendum)
Hydroxyzine capsules or tablets What is this medicine? HYDROXYZINE (hye [place] i zeen) is an antihistamine. This medicine is used to treat allergy symptoms. It is also used to treat anxiety and tension. This medicine can be used with other medicines to induce sleep before surgery. This medicine may be used for other purposes; ask your health care provider or pharmacist if you have questions. COMMON BRAND NAME(S): ANX, Atarax, Rezine, Vistaril What should I tell my health care provider before I take this medicine? They need to know if you have any of these conditions: -any chronic illness -difficulty passing urine -glaucoma -heart disease -kidney disease -liver disease -lung disease -an unusual or allergic reaction to hydroxyzine, cetirizine, other medicines, foods, dyes, or preservatives -pregnant or trying to get pregnant -breast-feeding How should I use this medicine? Take this medicine by mouth with a full glass of water. Follow the directions on the prescription label. You may take this medicine with food or on an empty stomach. Take your medicine at regular intervals. Do not take your medicine more often than directed. Talk to your pediatrician regarding the use of this medicine in children. Special care may be needed. While this drug may be prescribed for children as young as 75 years of age for selected conditions, precautions do apply. Patients over 62 years old may have a stronger reaction and need a smaller dose. Overdosage: If you think you have taken too much of this medicine contact a poison control center or emergency room at once. NOTE: This medicine is only for you. Do not share this medicine with others. What if I miss a dose? If you miss a dose, take it as soon as you can. If it is almost time for your next dose, take only that dose. Do not take double or extra doses. What may interact with this medicine? -alcohol -barbiturate medicines for sleep or seizures -medicines for  colds, allergies -medicines for depression, anxiety, or emotional disturbances -medicines for pain -medicines for sleep -muscle relaxants This list may not describe all possible interactions. Give your health care provider a list of all the medicines, herbs, non-prescription drugs, or dietary supplements you use. Also tell them if you smoke, drink alcohol, or use illegal drugs. Some items may interact with your medicine. What should I watch for while using this medicine? Tell your doctor or health care professional if your symptoms do not improve. You may get drowsy or dizzy. Do not drive, use machinery, or do anything that needs mental alertness until you know how this medicine affects you. Do not stand or sit up quickly, especially if you are an older patient. This reduces the risk of dizzy or fainting spells. Alcohol may interfere with the effect of this medicine. Avoid alcoholic drinks. Your mouth may get dry. Chewing sugarless gum or sucking hard candy, and drinking plenty of water may help. Contact your doctor if the problem does not go away or is severe. This medicine may cause dry eyes and blurred vision. If you wear contact lenses you may feel some discomfort. Lubricating drops may help. See your eye doctor if the problem does not go away or is severe. If you are receiving skin tests for allergies, tell your doctor you are using this medicine. What side effects may I notice from receiving this medicine? Side effects that you should report to your doctor or health care professional as soon as possible: -fast or irregular heartbeat -difficulty passing urine -seizures -slurred speech or confusion -tremor Side effects that  usually do not require medical attention (report to your doctor or health care professional if they continue or are bothersome): -constipation -drowsiness -fatigue -headache -stomach upset This list may not describe all possible side effects. Call your doctor for  medical advice about side effects. You may report side effects to FDA at 1-800-FDA-1088. Where should I keep my medicine? Keep out of the reach of children. Store at room temperature between 15 and 30 degrees C (59 and 86 degrees F). Keep container tightly closed. Throw away any unused medicine after the expiration date. NOTE: This sheet is a summary. It may not cover all possible information. If you have questions about this medicine, talk to your doctor, pharmacist, or health care provider.  2018 Elsevier/Gold Standard (2008-02-14 14:50:59)      Sulfamethoxazole; Trimethoprim, SMX-TMP tablets What is this medicine? SULFAMETHOXAZOLE; TRIMETHOPRIM or SMX-TMP (suhl fuh meth OK suh zohl; trye METH oh prim) is a combination of a sulfonamide antibiotic and a second antibiotic, trimethoprim. It is used to treat or prevent certain kinds of bacterial infections. It will not work for colds, flu, or other viral infections. This medicine may be used for other purposes; ask your health care provider or pharmacist if you have questions. COMMON BRAND NAME(S): Bacter-Aid DS, Bactrim, Bactrim DS, Septra, Septra DS What should I tell my health care provider before I take this medicine? They need to know if you have any of these conditions: -anemia -asthma -being treated with anticonvulsants -if you frequently drink alcohol containing drinks -kidney disease -liver disease -low level of folic acid or HQIONGE-9-[id number]-6-phosphate dehydrogenase -poor nutrition or malabsorption -porphyria -severe allergies -thyroid disorder -an unusual or allergic reaction to sulfamethoxazole, trimethoprim, sulfa drugs, other medicines, foods, dyes, or preservatives -pregnant or trying to get pregnant -breast-feeding How should I use this medicine? Take this medicine by mouth with a full glass of water. Follow the directions on the prescription label. Take your medicine at regular intervals. Do not take it more often than  directed. Do not skip doses or stop your medicine early. Talk to your pediatrician regarding the use of this medicine in children. Special care may be needed. This medicine has been used in children as young as 442 months of age. Overdosage: If you think you have taken too much of this medicine contact a poison control center or emergency room at once. NOTE: This medicine is only for you. Do not share this medicine with others. What if I miss a dose? If you miss a dose, take it as soon as you can. If it is almost time for your next dose, take only that dose. Do not take double or extra doses. What may interact with this medicine? Do not take this medicine with any of the following medications: -aminobenzoate potassium -dofetilide -metronidazole This medicine may also interact with the following medications: -ACE inhibitors like benazepril, enalapril, lisinopril, and ramipril -birth control pills -cyclosporine -digoxin -diuretics -indomethacin -medicines for diabetes -methenamine -methotrexate -phenytoin -potassium supplements -pyrimethamine -sulfinpyrazone -tricyclic antidepressants -warfarin This list may not describe all possible interactions. Give your health care provider a list of all the medicines, herbs, non-prescription drugs, or dietary supplements you use. Also tell them if you smoke, drink alcohol, or use illegal drugs. Some items may interact with your medicine. What should I watch for while using this medicine? Tell your doctor or health care professional if your symptoms do not improve. Drink several glasses of water a day to reduce the risk of kidney problems. Do not treat diarrhea  with over the counter products. Contact your doctor if you have diarrhea that lasts more than 2 days or if it is severe and watery. This medicine can make you more sensitive to the sun. Keep out of the sun. If you cannot avoid being in the sun, wear protective clothing and use a sunscreen. Do  not use sun lamps or tanning beds/booths. What side effects may I notice from receiving this medicine? Side effects that you should report to your doctor or health care professional as soon as possible: -allergic reactions like skin rash or hives, swelling of the face, lips, or tongue -breathing problems -fever or chills, sore throat -irregular heartbeat, chest pain -joint or muscle pain -pain or difficulty passing urine -red pinpoint spots on skin -redness, blistering, peeling or loosening of the skin, including inside the mouth -unusual bleeding or bruising -unusually weak or tired -yellowing of the eyes or skin Side effects that usually do not require medical attention (report to your doctor or health care professional if they continue or are bothersome): -diarrhea -dizziness -headache -loss of appetite -nausea, vomiting -nervousness This list may not describe all possible side effects. Call your doctor for medical advice about side effects. You may report side effects to FDA at 1-800-FDA-1088. Where should I keep my medicine? Keep out of the reach of children. Store at room temperature between 20 to 25 degrees C (68 to 77 degrees F). Protect from light. Throw away any unused medicine after the expiration date. NOTE: This sheet is a summary. It may not cover all possible information. If you have questions about this medicine, talk to your doctor, pharmacist, or health care provider.  2018 Elsevier/Gold Standard (2013-05-09 14:38:26)

## 2018-07-05 NOTE — Telephone Encounter (Signed)
Patient notified of the message and we will wait to see if there are any medication changes

## 2018-07-15 ENCOUNTER — Encounter: Payer: Self-pay | Admitting: Family Medicine

## 2018-07-15 ENCOUNTER — Ambulatory Visit (INDEPENDENT_AMBULATORY_CARE_PROVIDER_SITE_OTHER): Payer: Medicaid Other | Admitting: Family Medicine

## 2018-07-15 VITALS — BP 96/64 | HR 90 | Temp 97.8°F | Ht 62.0 in | Wt 112.0 lb

## 2018-07-15 DIAGNOSIS — R21 Rash and other nonspecific skin eruption: Secondary | ICD-10-CM | POA: Diagnosis not present

## 2018-07-15 DIAGNOSIS — Z09 Encounter for follow-up examination after completed treatment for conditions other than malignant neoplasm: Secondary | ICD-10-CM

## 2018-07-15 DIAGNOSIS — Z23 Encounter for immunization: Secondary | ICD-10-CM

## 2018-07-15 DIAGNOSIS — J302 Other seasonal allergic rhinitis: Secondary | ICD-10-CM | POA: Diagnosis not present

## 2018-07-15 DIAGNOSIS — L299 Pruritus, unspecified: Secondary | ICD-10-CM

## 2018-07-15 DIAGNOSIS — F419 Anxiety disorder, unspecified: Secondary | ICD-10-CM | POA: Diagnosis not present

## 2018-07-15 DIAGNOSIS — F331 Major depressive disorder, recurrent, moderate: Secondary | ICD-10-CM | POA: Diagnosis not present

## 2018-07-15 MED ORDER — TRIAMCINOLONE ACETONIDE 0.1 % EX CREA: 1.0000 "application " | TOPICAL_CREAM | Freq: Two times a day (BID) | CUTANEOUS | 3 refills | Status: DC

## 2018-07-15 MED ORDER — CETIRIZINE HCL 10 MG PO TABS: 10.0000 mg | ORAL_TABLET | Freq: Every day | ORAL | 11 refills | Status: AC

## 2018-07-15 NOTE — Patient Instructions (Signed)
Nystatin; Triamcinolone cream or ointment What is this medicine? NYSTATIN; TRIAMCINOLONE (nye STAT in; trye am SIN oh lone) is a combination of an antifungal medicine and a steroid. It is used to treat certain kinds of fungal or yeast infections of the skin. This medicine may be used for other purposes; ask your health care provider or pharmacist if you have questions. COMMON BRAND NAME(S): Myco-Triacet-II, Mycogen-II, Mycolog II, Mytrex, N.T.A. What should I tell my health care provider before I take this medicine? They need to know if you have any of these conditions: -large areas of burned or damaged skin -skin wasting or thinning -peripheral vascular disease or poor circulation -an unusual or allergic reaction to nystatin, triamcinolone, other corticosteroids, other medicines, foods, dyes, or preservatives -pregnant or trying to get pregnant -breast-feeding How should I use this medicine? This medicine is for external use only. Do not take by mouth. Follow the directions on the prescription label. Wash your hands before and after use. If treating hand or nail infections, wash hands before use only. Apply a thin layer of this medicine to the affected area and rub in gently. Do not use on healthy skin or over large areas of skin. Do not get this medicine in your eyes. If you do, rinse out with plenty of cool tap water. When applying to the groin area, apply a limited amount and do not use for longer than 2 weeks unless directed to by your doctor or health care professional. Do not cover or wrap the treated area with an airtight bandage (such as a plastic bandage). Use the full course of treatment prescribed, even if you think the infection is getting better. Use at regular intervals. Do not use your medicine more often than directed. Do not use this medicine for any condition other than the one for which it was prescribed. Talk to your pediatrician regarding the use of this medicine in children.  While this drug may be prescribed for selected conditions, precautions do apply. Children being treated in the diaper area should not wear tight-fitting diapers or plastic pants. Elderly patients are more likely to have damaged skin through aging, and this may increase side effects. This medicine should only be used for brief periods and infrequently in older patients. Overdosage: If you think you have taken too much of this medicine contact a poison control center or emergency room at once. NOTE: This medicine is only for you. Do not share this medicine with others. What if I miss a dose? If you miss a dose, use it as soon as you can. If it is almost time for your next dose, use only that dose. Do not use double or extra doses. What may interact with this medicine? Interactions are not expected. Do not use any other skin products on the affected area without telling your doctor or health care professional. This list may not describe all possible interactions. Give your health care provider a list of all the medicines, herbs, non-prescription drugs, or dietary supplements you use. Also tell them if you smoke, drink alcohol, or use illegal drugs. Some items may interact with your medicine. What should I watch for while using this medicine? Tell your doctor or health care professional if your symptoms do not start to get better within 1 week when treating the groin area or within 2 weeks when treating the feet. . Tell your doctor or health care professional if you develop sores or blisters that do not heal properly. If your skin  infection returns after stopping this medicine, contact your doctor or health care professional. If you are using this medicine to treat an infection in the groin area, do not wear underwear that is tight-fitting or made from synthetic fibers such as rayon or nylon. Instead, wear loose-fitting, cotton underwear. Also dry the area completely after bathing. What side effects may I  notice from receiving this medicine? Side effects that you should report to your doctor or health care professional as soon as possible: -burning or itching of the skin -dark red spots on the skin -loss of feeling on skin -painful, red, pus-filled blisters in hair follicles -skin infection -thinning of the skin or sunburn: more likely if applied to the face Side effects that usually do not require medical attention (report to your doctor or health care professional if they continue or are bothersome): -dry or peeling skin -skin irritation This list may not describe all possible side effects. Call your doctor for medical advice about side effects. You may report side effects to FDA at 1-800-FDA-1088. Where should I keep my medicine? Keep out of the reach of children. Store at room temperature between 15 and 30 degrees C (59 and 86 degrees F). Do not freeze. Throw away any unused medicine after the expiration date. NOTE: This sheet is a summary. It may not cover all possible information. If you have questions about this medicine, talk to your doctor, pharmacist, or health care provider.  2018 Elsevier/Gold Standard (2008-04-24 17:29:26)    Cetirizine tablets What is this medicine? CETIRIZINE (se TI ra zeen) is an antihistamine. This medicine is used to treat or prevent symptoms of allergies. It is also used to help reduce itchy skin rash and hives. This medicine may be used for other purposes; ask your health care provider or pharmacist if you have questions. COMMON BRAND NAME(S): All Day Allergy, Zyrtec, Zyrtec Hives Relief What should I tell my health care provider before I take this medicine? They need to know if you have any of these conditions: -kidney disease -liver disease -an unusual or allergic reaction to cetirizine, hydroxyzine, other medicines, foods, dyes, or preservatives -pregnant or trying to get pregnant -breast-feeding How should I use this medicine? Take this  medicine by mouth with a glass of water. Follow the directions on the prescription label. You can take this medicine with food or on an empty stomach. Take your medicine at regular times. Do not take more often than directed. You may need to take this medicine for several days before your symptoms improve. Talk to your pediatrician regarding the use of this medicine in children. Special care may be needed. While this drug may be prescribed for children as young as 64 years of age for selected conditions, precautions do apply. Overdosage: If you think you have taken too much of this medicine contact a poison control center or emergency room at once. NOTE: This medicine is only for you. Do not share this medicine with others. What if I miss a dose? If you miss a dose, take it as soon as you can. If it is almost time for your next dose, take only that dose. Do not take double or extra doses. What may interact with this medicine? -alcohol -certain medicines for anxiety or sleep -narcotic medicines for pain -other medicines for colds or allergies This list may not describe all possible interactions. Give your health care provider a list of all the medicines, herbs, non-prescription drugs, or dietary supplements you use. Also tell them  if you smoke, drink alcohol, or use illegal drugs. Some items may interact with your medicine. What should I watch for while using this medicine? Visit your doctor or health care professional for regular checks on your health. Tell your doctor if your symptoms do not improve. You may get drowsy or dizzy. Do not drive, use machinery, or do anything that needs mental alertness until you know how this medicine affects you. Do not stand or sit up quickly, especially if you are an older patient. This reduces the risk of dizzy or fainting spells. Your mouth may get dry. Chewing sugarless gum or sucking hard candy, and drinking plenty of water may help. Contact your doctor if the  problem does not go away or is severe. What side effects may I notice from receiving this medicine? Side effects that you should report to your doctor or health care professional as soon as possible: -allergic reactions like skin rash, itching or hives, swelling of the face, lips, or tongue -changes in vision or hearing -fast or irregular heartbeat -trouble passing urine or change in the amount of urine Side effects that usually do not require medical attention (report to your doctor or health care professional if they continue or are bothersome): -dizziness -dry mouth -irritability -sore throat -stomach pain -tiredness This list may not describe all possible side effects. Call your doctor for medical advice about side effects. You may report side effects to FDA at 1-800-FDA-1088. Where should I keep my medicine? Keep out of the reach of children. Store at room temperature between 15 and 30 degrees C (59 and 86 degrees F). Throw away any unused medicine after the expiration date. NOTE: This sheet is a summary. It may not cover all possible information. If you have questions about this medicine, talk to your doctor, pharmacist, or health care provider.  2018 Elsevier/Gold Standard (2014-10-27 13:44:42)

## 2018-07-15 NOTE — Progress Notes (Signed)
Sick Visit--Rash  Subjective:    Patient ID: Mackenzie Gomez, female    DOB: 12-27-1980, 37 y.o.   MRN: [id number]  Chief Complaint  Patient presents with  . Skin rash     HPI  [name] is a 37 year old female with a past medical history of Renal Insufficiency, MM, Hypertension, Diabetes, CHF, Cancer, and Asthma. She is here today for a sick visit.   Current Status: Since her last office visit, she is doing well. She has c/o worsening itching of rash, in which she has not had any relief from Hydralazine and Zyrtec. She also has been having increasing anxiety lately. She denies suicidal ideations, homicidal ideations, or auditory hallucinations. She requests referral to Psychiatrist today.    She denies fevers, chills, fatigue, recent infections, weight loss, and night sweats. She has not had any headaches, visual changes, dizziness, and falls. No chest pain, heart palpitations, cough and shortness of breath reported. No reports of GI problems such as nausea, vomiting, diarrhea, and constipation. She has no reports of blood in stools, dysuria and hematuria. No depression or anxiety, and She denies pain today.   Past Medical History:  Diagnosis Date  . Kidney stone   . Mental disorder   . Urinary tract infection     Family History  Problem Relation Age of Onset  . Diabetes Father   . Heart disease Father   . Hyperlipidemia Father   . Cancer Maternal Grandmother   . Diabetes Paternal Grandfather     Social History   Socioeconomic History  . Marital status: Married    Spouse name: Not on file  . Number of children: Not on file  . Years of education: Not on file  . Highest education level: Not on file  Occupational History  . Not on file  Social Needs  . Financial resource strain: Not on file  . Food insecurity:    Worry: Not on file    Inability: Not on file  . Transportation needs:    Medical: Not on file    Non-medical: Not on file  Tobacco Use  . Smoking status: Never  Smoker  . Smokeless tobacco: Never Used  Substance and Sexual Activity  . Alcohol use: No  . Drug use: No  . Sexual activity: Yes  Lifestyle  . Physical activity:    Days per week: Not on file    Minutes per session: Not on file  . Stress: Not on file  Relationships  . Social connections:    Talks on phone: Not on file    Gets together: Not on file    Attends religious service: Not on file    Active member of club or organization: Not on file    Attends meetings of clubs or organizations: Not on file    Relationship status: Not on file  . Intimate partner violence:    Fear of current or ex partner: Not on file    Emotionally abused: Not on file    Physically abused: Not on file    Forced sexual activity: Not on file  Other Topics Concern  . Not on file  Social History Narrative  . Not on file    Past Surgical History:  Procedure Laterality Date  . NO PAST SURGERIES      Immunization History  Administered Date(s) Administered  . Influenza,inj,Quad PF,6+ Mos 07/15/2018  . Influenza-Unspecified 07/30/2014  . Rho (D) Immune Globulin 12/29/2012    Current Meds  Medication Sig  .  albuterol (PROAIR HFA) 108 (90 Base) MCG/ACT inhaler Inhale 90 mcg into the lungs.  Marland Kitchen aspirin 81 MG chewable tablet Chew by mouth daily.  . busPIRone (BUSPAR) 5 MG tablet Take 1 tablet (5 mg total) by mouth 2 (two) times daily.  Marland Kitchen escitalopram (LEXAPRO) 20 MG tablet Take 1 tablet (20 mg total) by mouth daily.  . hydrOXYzine (ATARAX/VISTARIL) 10 MG tablet Take 1 tablet (10 mg total) by mouth 3 (three) times daily as needed.  . montelukast (SINGULAIR) 10 MG tablet Take 10 mg by mouth daily.    Allergies  Allergen Reactions  . Ferumoxytol Shortness Of Breath    Nausea, vomiting, and chest pain Nausea, vomiting, and chest pain   . Latex Hives, Rash, Itching and Other (See Comments)    unknown   . Iron Dextran   . Penicillins Other (See Comments)    Unknown--childhood reaction    BP  96/64 (BP Location: Left Arm, Patient Position: Sitting, Cuff Size: Small)   Pulse 90   Temp 97.8 F (36.6 C) (Oral)   Ht 5\' 2"  (1.575 m)   Wt 112 lb (50.8 kg)   SpO2 100%   BMI 20.49 kg/m   Review of Systems  Constitutional: Negative.   Respiratory: Negative.   Cardiovascular: Negative.   Gastrointestinal: Negative.   Genitourinary: Negative.   Skin: Positive for rash (abdomen, back, right hip, shoulder blades).  Psychiatric/Behavioral: The patient is nervous/anxious.    Objective:   Physical Exam  Cardiovascular: Normal rate, regular rhythm, normal heart sounds and intact distal pulses.  Pulmonary/Chest: Effort normal and breath sounds normal.  Abdominal: Soft. Bowel sounds are normal.  Skin: Rash noted. There is erythema.           Assessment & Plan:   1. Itching - triamcinolone cream (KENALOG) 0.1 %; Apply 1 application topically 2 (two) times daily.  Dispense: 30 g; Refill: 3 - cetirizine (ZYRTEC) 10 MG tablet; Take 1 tablet (10 mg total) by mouth daily.  Dispense: 30 tablet; Refill: 11 - Ambulatory referral to Allergy  2. Rash - triamcinolone cream (KENALOG) 0.1 %; Apply 1 application topically 2 (two) times daily.  Dispense: 30 g; Refill: 3 - Ambulatory referral to Allergy  3. Seasonal allergies - triamcinolone cream (KENALOG) 0.1 %; Apply 1 application topically 2 (two) times daily.  Dispense: 30 g; Refill: 3 - cetirizine (ZYRTEC) 10 MG tablet; Take 1 tablet (10 mg total) by mouth daily.  Dispense: 30 tablet; Refill: 11 - Ambulatory referral to Allergy  4. Anxiety He will continue Buspar and Lexopro as prescribed. Referral to psychiatry today.  - Ambulatory referral to Psychiatry  5. Moderate episode of recurrent major depressive disorder (HCC) Anxiety is increased.  - Ambulatory referral to Psychiatry  6. Need for immunization against influenza - Flu Vaccine QUAD 36+ mos IM  7. Follow up She will keep follow up appointment on 09/2018.  Meds  ordered this encounter  Medications  . triamcinolone cream (KENALOG) 0.1 %    Sig: Apply 1 application topically 2 (two) times daily.    Dispense:  30 g    Refill:  3  . cetirizine (ZYRTEC) 10 MG tablet    Sig: Take 1 tablet (10 mg total) by mouth daily.    Dispense:  30 tablet    Refill:  11    Referral Orders     Ambulatory referral to Allergy     Ambulatory referral to Psychiatry   Raliegh Ip,  MSN, FNP-C [facility] [address]  [place], [place] [address] [phone]

## 2018-07-16 ENCOUNTER — Other Ambulatory Visit: Payer: Self-pay | Admitting: Family Medicine

## 2018-07-16 DIAGNOSIS — R21 Rash and other nonspecific skin eruption: Secondary | ICD-10-CM

## 2018-07-16 MED ORDER — PREDNISONE 10 MG PO TABS: 10.0000 mg | ORAL_TABLET | Freq: Every day | ORAL | 0 refills | Status: DC

## 2018-07-16 NOTE — Progress Notes (Signed)
Rx for Prednisone for rash, to pharmacy today.

## 2018-07-20 ENCOUNTER — Other Ambulatory Visit: Payer: Self-pay | Admitting: Family Medicine

## 2018-07-22 NOTE — Telephone Encounter (Signed)
Refill for escitalopram 20 mg  PCP  Raliegh Ip  [facility]  Please review

## 2018-07-26 ENCOUNTER — Encounter: Payer: Self-pay | Admitting: Allergy

## 2018-07-26 ENCOUNTER — Ambulatory Visit (INDEPENDENT_AMBULATORY_CARE_PROVIDER_SITE_OTHER): Payer: Medicaid Other | Admitting: Allergy

## 2018-07-26 VITALS — BP 110/60 | HR 73 | Temp 98.4°F | Resp 20 | Ht 62.0 in | Wt 109.0 lb

## 2018-07-26 DIAGNOSIS — J3089 Other allergic rhinitis: Secondary | ICD-10-CM

## 2018-07-26 DIAGNOSIS — Z880 Allergy status to penicillin: Secondary | ICD-10-CM

## 2018-07-26 DIAGNOSIS — L299 Pruritus, unspecified: Secondary | ICD-10-CM | POA: Diagnosis not present

## 2018-07-26 DIAGNOSIS — J302 Other seasonal allergic rhinitis: Secondary | ICD-10-CM

## 2018-07-26 DIAGNOSIS — L309 Dermatitis, unspecified: Secondary | ICD-10-CM

## 2018-07-26 DIAGNOSIS — H1013 Acute atopic conjunctivitis, bilateral: Secondary | ICD-10-CM | POA: Insufficient documentation

## 2018-07-26 DIAGNOSIS — J4520 Mild intermittent asthma, uncomplicated: Secondary | ICD-10-CM | POA: Diagnosis not present

## 2018-07-26 HISTORY — DX: Mild intermittent asthma, uncomplicated: J45.20

## 2018-07-26 MED ORDER — OLOPATADINE HCL 0.2 % OP SOLN: 1.0000 [drp] | OPHTHALMIC | 5 refills | Status: DC

## 2018-07-26 MED ORDER — CRISABOROLE 2 % EX OINT: 1.0000 "application " | TOPICAL_OINTMENT | Freq: Two times a day (BID) | CUTANEOUS | 5 refills | Status: DC

## 2018-07-26 MED ORDER — MOMETASONE FUROATE 50 MCG/ACT NA SUSP: 2.0000 | Freq: Every day | NASAL | 5 refills | Status: DC

## 2018-07-26 NOTE — Patient Instructions (Addendum)
Dermatitis Rash and pruritus for the past 5 years mainly on her torso.  Usually worse during the summer and fall.  90% resolution of symptoms with prednisone.  Tried over-the-counter antihistamines some benefit and topical steroid creams with no benefit. Based on today's skin testing results below, highly suspicious if this rash/pruritus is all contributed by the environmental allergies.  A prescription has been provided for Eucrisa (crisaborole) 2% ointment twice a day to affected areas as needed. Sample given.   Start zyrtec 10mg  twice a day.  If symptoms not improved let me know. Skin care recommendations Bath time: . Always use lukewarm water. AVOID very hot or cold water. Marland Kitchen Keep bathing time to 5-10 minutes. . Do NOT use bubble bath. . Use a mild soap and use just enough to wash the dirty areas. . Do NOT scrub skin vigorously.  . After bathing, pat dry your skin with a towel. Do NOT rub or scrub the skin. Moisturizers and prescriptions:  . ALWAYS apply moisturizers immediately after bathing (within 3 minutes). This helps to lock-in moisture. . Use the moisturizer several times a day over the whole body. Peri Jefferson summer moisturizers include: Aveeno, CeraVe, Cetaphil. Peri Jefferson winter moisturizers include: Aquaphor, Vaseline, Cerave, Cetaphil, Eucerin, Vanicream. . When using moisturizers along with medications, the moisturizer should be applied about one hour after applying the medication to prevent diluting effect of the medication or moisturize around where you applied the medications. When not using medications, the moisturizer can be continued twice daily as maintenance. Laundry and clothing: . Avoid laundry products with added color or perfumes. . Use unscented hypo-allergenic laundry products such as Tide free, Cheer free & gentle, and All free and clear.  . If the skin still seems dry or sensitive, you can try double-rinsing the clothes. . Avoid tight or scratchy clothing such as  wool. . Do not use fabric softeners or dyer sheets.  Perennial allergic rhinitis with seasonal variation Perennial rhinoconjunctivitis symptoms in the past 30+ years with worsening in the fall and spring.  Used over-the-counter antihistamines, eyedrops with some benefit. Flonase caused epistaxis and burning sensation.  Today's skin prick testing was positive to: trees, weeds, grass, ragweed, dust mite, cat, mold and cockroaches.  Start mometasone (Nasonex) 2 sprays once a day. Demonstrated proper use.   May use over the counter antihistamines such as Zyrtec (cetirizine), Claritin (loratadine), Allegra (fexofenadine), or Xyzal (levocetirizine) daily as needed.  Continue Singulair daily.  Provided information regarding allergy immunotherapy.  Discussed environments control measures. Reducing Pollen Exposure . Pollen seasons: trees (spring), grass (summer) and ragweed/weeds (fall). Marland Kitchen Keep windows closed in your home and car to lower pollen exposure.  Lilian Kapur air conditioning in the bedroom and throughout the house if possible.  . Avoid going out in dry windy days - especially early morning. . Pollen counts are highest between 5 - 10 AM and on dry, hot and windy days.  . Save outside activities for late afternoon or after a heavy rain, when pollen levels are lower.  . Avoid mowing of grass if you have grass pollen allergy. Marland Kitchen Be aware that pollen can also be transported indoors on people and pets.  . Dry your clothes in an automatic dryer rather than hanging them outside where they might collect pollen.  . Rinse hair and eyes before bedtime. Control of House Dust Mite Allergen . Dust mite allergens are a common trigger of allergy and asthma symptoms. While they can be found throughout the house, these microscopic  creatures thrive in warm, humid environments such as bedding, upholstered furniture and carpeting. . Because so much time is spent in the bedroom, it is essential to reduce mite  levels there.  . Encase pillows, mattresses, and box springs in special allergen-proof fabric covers or airtight, zippered plastic covers.  . Bedding should be washed weekly in hot water (130 F) and dried in a hot dryer. Allergen-proof covers are available for comforters and pillows that can't be regularly washed.  Reyes Ivan the allergy-proof covers every few months. Minimize clutter in the bedroom. Keep pets out of the bedroom.  Marland Kitchen Keep humidity less than 50% by using a dehumidifier or air conditioning. You can buy a humidity measuring device called a hygrometer to monitor this.  . If possible, replace carpets with hardwood, linoleum, or washable area rugs. If that's not possible, vacuum frequently with a vacuum that has a HEPA filter. . Remove all upholstered furniture and non-washable window drapes from the bedroom. . Remove all non-washable stuffed toys from the bedroom.  Wash stuffed toys weekly. Pet Allergen Avoidance: . Contrary to popular opinion, there are no "hypoallergenic" breeds of dogs or cats. That is because people are not allergic to an animal's hair, but to an allergen found in the animal's saliva, dander (dead skin flakes) or urine. Pet allergy symptoms typically occur within minutes. For some people, symptoms can build up and become most severe 8 to 12 hours after contact with the animal. People with severe allergies can experience reactions in public places if dander has been transported on the pet owners' clothing. Marland Kitchen Keeping an animal outdoors is only a partial solution, since homes with pets in the yard still have higher concentrations of animal allergens. . Before getting a pet, ask your allergist to determine if you are allergic to animals. If your pet is already considered part of your family, try to minimize contact and keep the pet out of the bedroom and other rooms where you spend a great deal of time. . As with dust mites, vacuum carpets often or replace carpet with a hardwood  floor, tile or linoleum. . High-efficiency particulate air (HEPA) cleaners can reduce allergen levels over time. . While dander and saliva are the source of cat and dog allergens, urine is the source of allergens from rabbits, hamsters, mice and [place] pigs; so ask a non-allergic family member to clean the animal's cage. . If you have a pet allergy, talk to your allergist about the potential for allergy immunotherapy (allergy shots). This strategy can often provide long-term relief. Mold Control . Mold and fungi can grow on a variety of surfaces provided certain temperature and moisture conditions exist.  . Outdoor molds grow on plants, decaying vegetation and soil. The major outdoor mold, Alternaria and Cladosporium, are found in very high numbers during hot and dry conditions. Generally, a late summer - fall peak is seen for common outdoor fungal spores. Rain will temporarily lower outdoor mold spore count, but counts rise rapidly when the rainy period ends. . The most important indoor molds are Aspergillus and Penicillium. Dark, humid and poorly ventilated basements are ideal sites for mold growth. The next most common sites of mold growth are the bathroom and the kitchen. Outdoor (Seasonal) Mold Control . Use air conditioning and keep windows closed. . Avoid exposure to decaying vegetation. Marland Kitchen Avoid leaf raking. . Avoid grain handling. . Consider wearing a face mask if working in moldy areas.  Indoor (Perennial) Mold Control  . Maintain humidity  below 50%. . Get rid of mold growth on hard surfaces with water, detergent and, if necessary, 5% bleach (do not mix with other cleaners). Then dry the area completely. If mold covers an area more than 10 square feet, consider hiring an indoor environmental professional. . For clothing, washing with soap and water is best. If moldy items cannot be cleaned and dried, throw them away. . Remove sources e.g. contaminated carpets. . Repair and seal leaking  roofs or pipes. Using dehumidifiers in damp basements may be helpful, but empty the water and clean units regularly to prevent mildew from forming. All rooms, especially basements, bathrooms and kitchens, require ventilation and cleaning to deter mold and mildew growth. Avoid carpeting on concrete or damp floors, and storing items in damp areas. Cockroach Allergen Avoidance Cockroaches are often found in the homes of densely populated urban areas, schools or commercial buildings, but these creatures can lurk almost anywhere. This does not mean that you have a dirty house or living area. . Block all areas where roaches can enter the home. This includes crevices, wall cracks and windows.  . Cockroaches need water to survive, so fix and seal all leaky faucets and pipes. Have an exterminator go through the house when your family and pets are gone to eliminate any remaining roaches. Marland Kitchen Keep food in lidded containers and put pet food dishes away after your pets are done eating. Vacuum and sweep the floor after meals, and take out garbage and recyclables. Use lidded garbage containers in the kitchen. Wash dishes immediately after use and clean under stoves, refrigerators or toasters where crumbs can accumulate. Wipe off the stove and other kitchen surfaces and cupboards regularly.  Allergic conjunctivitis, bilateral  See as above for allergic rhinitis.  May use pataday 1 drop once a day as needed.   Mild intermittent asthma Diagnosed with asthma 20 years ago.  Triggered by allergies and infections.  No recent albuterol use.  Had one course of prednisone this past 12 months.  Today spirometry was normal.  May use albuterol rescue inhaler 2 puffs or nebulizer every 4 to 6 hours as needed for shortness of breath, chest tightness, coughing, and wheezing. May use albuterol rescue inhaler 2 puffs 5 to 15 minutes prior to strenuous physical activities.  Monitor symptoms. If having a flare, let us  know.  History of penicillin allergy Reaction as a child.  Unsure of specific events.  Continue to avoid penicillin and consider penicillin skin testing and challenge in the future.  Return in about 3 months (around 10/26/2018).

## 2018-07-26 NOTE — Assessment & Plan Note (Addendum)
Diagnosed with asthma 20 years ago.  Triggered by allergies and infections.  No recent albuterol use.  Had one course of prednisone this past 12 months.  Today spirometry was normal.  May use albuterol rescue inhaler 2 puffs or nebulizer every 4 to 6 hours as needed for shortness of breath, chest tightness, coughing, and wheezing. May use albuterol rescue inhaler 2 puffs 5 to 15 minutes prior to strenuous physical activities.  Monitor symptoms. If having a flare, let us know.

## 2018-07-26 NOTE — Assessment & Plan Note (Signed)
Reaction as a child.  Unsure of specific events.  Continue to avoid penicillin and consider penicillin skin testing and challenge in the future.

## 2018-07-26 NOTE — Assessment & Plan Note (Addendum)
Rash and pruritus for the past 5 years mainly on her torso.  Usually worse during the summer and fall.  90% resolution of symptoms with prednisone.  Tried over-the-counter antihistamines some benefit and topical steroid creams with no benefit. Based on today's skin testing results below, highly suspicious if this rash/pruritus is all contributed by the environmental allergies.  A prescription has been provided for Eucrisa (crisaborole) 2% ointment twice a day to affected areas as needed. Sample given.   Start zyrtec 10mg  twice a day.  If symptoms not improved let me know. Skin care recommendations Bath time: . Always use lukewarm water. AVOID very hot or cold water. Marland Kitchen Keep bathing time to 5-10 minutes. . Do NOT use bubble bath. . Use a mild soap and use just enough to wash the dirty areas. . Do NOT scrub skin vigorously.  . After bathing, pat dry your skin with a towel. Do NOT rub or scrub the skin. Moisturizers and prescriptions:  . ALWAYS apply moisturizers immediately after bathing (within 3 minutes). This helps to lock-in moisture. . Use the moisturizer several times a day over the whole body. Peri Jefferson summer moisturizers include: Aveeno, CeraVe, Cetaphil. Peri Jefferson winter moisturizers include: Aquaphor, Vaseline, Cerave, Cetaphil, Eucerin, Vanicream. . When using moisturizers along with medications, the moisturizer should be applied about one hour after applying the medication to prevent diluting effect of the medication or moisturize around where you applied the medications. When not using medications, the moisturizer can be continued twice daily as maintenance. Laundry and clothing: . Avoid laundry products with added color or perfumes. . Use unscented hypo-allergenic laundry products such as Tide free, Cheer free & gentle, and All free and clear.  . If the skin still seems dry or sensitive, you can try double-rinsing the clothes. . Avoid tight or scratchy clothing such as wool. . Do not  use fabric softeners or dyer sheets.

## 2018-07-26 NOTE — Progress Notes (Signed)
New Patient Note  RE: Mackenzie Gomez MRN: [id number] DOB: Jan 01, 1981 Date of Office Visit: 07/26/2018  Referring provider: Kallie Locks, FNP Primary care provider: Kallie Locks, FNP  Chief Complaint: Allergic Reaction (itching rash)  History of Present Illness: I had the pleasure of seeing Mackenzie Gomez for initial evaluation at the [facility] on 07/26/2018. She is a 37 y.o. female, who is referred here by Kallie Locks, FNP for the evaluation of rash.   Rash: Describes the rash as raised and pruritic. This has been happening for the past 5 years and started on her hip. Originally they thought it was shingles but treatment for this did not help and the rash reappeared the following year.  This year the rash is on her right hip and spreads to the back. She was started on prednisone 10mg  daily for the past 5 days with 90% improvement in symptoms. Now off prednisone and antihistamines and the rash is back.  The rash usually waxes and wanes and seems to be worse with heat during the summer and fall. Asymptomatic during the winter and spring. Currently having associated periorbital pruritus.  Patient had a tattoo around that area for about 15 years.  She has tried triamcinolone cream and hydrocortisone cream with no benefit. Tried various OTC antihistamines such as zyrtec, xyzal and benadryl with some benefit. Benadryl seems to help the best but it causes drowsiness. She also tried hydroxyzine which helped but caused drowsiness.   No dermatology evaluation. No recent bloodwork. Denies any changes in diet, medications, personal care products. Buspar was added about 2 months ago.   Uses Jergen's moisturizer.   Assessment and Plan: Shylo is a 37 y.o. female with: Dermatitis Rash and pruritus for the past 5 years mainly on her torso.  Usually worse during the summer and fall.  90% resolution of symptoms with prednisone.  Tried over-the-counter antihistamines some  benefit and topical steroid creams with no benefit. Based on today's skin testing results below, highly suspicious if this rash/pruritus is all contributed by the environmental allergies.  A prescription has been provided for Eucrisa (crisaborole) 2% ointment twice a day to affected areas as needed. Sample given.   Start zyrtec 10mg  twice a day.  If symptoms not improved let me know. Skin care recommendations Bath time: . Always use lukewarm water. AVOID very hot or cold water. Marland Kitchen Keep bathing time to 5-10 minutes. . Do NOT use bubble bath. . Use a mild soap and use just enough to wash the dirty areas. . Do NOT scrub skin vigorously.  . After bathing, pat dry your skin with a towel. Do NOT rub or scrub the skin. Moisturizers and prescriptions:  . ALWAYS apply moisturizers immediately after bathing (within 3 minutes). This helps to lock-in moisture. . Use the moisturizer several times a day over the whole body. Peri Jefferson summer moisturizers include: Aveeno, CeraVe, Cetaphil. Peri Jefferson winter moisturizers include: Aquaphor, Vaseline, Cerave, Cetaphil, Eucerin, Vanicream. . When using moisturizers along with medications, the moisturizer should be applied about one hour after applying the medication to prevent diluting effect of the medication or moisturize around where you applied the medications. When not using medications, the moisturizer can be continued twice daily as maintenance. Laundry and clothing: . Avoid laundry products with added color or perfumes. . Use unscented hypo-allergenic laundry products such as Tide free, Cheer free & gentle, and All free and clear.  . If the skin still seems dry or sensitive,  you can try double-rinsing the clothes. . Avoid tight or scratchy clothing such as wool. . Do not use fabric softeners or dyer sheets.  Perennial allergic rhinitis with seasonal variation Perennial rhinoconjunctivitis symptoms in the past 30+ years with worsening in the fall and spring.   Used over-the-counter antihistamines, eyedrops with some benefit. Flonase caused epistaxis and burning sensation.  Today's skin prick testing was positive to: trees, weeds, grass, ragweed, dust mite, cat, mold and cockroaches.  Start mometasone (Nasonex) 2 sprays once a day. Demonstrated proper use.   May use over the counter antihistamines such as Zyrtec (cetirizine), Claritin (loratadine), Allegra (fexofenadine), or Xyzal (levocetirizine) daily as needed.  Continue Singulair daily.  Provided information regarding allergy immunotherapy.  Discussed environments control measures. Reducing Pollen Exposure . Pollen seasons: trees (spring), grass (summer) and ragweed/weeds (fall). Marland Kitchen Keep windows closed in your home and car to lower pollen exposure.  Lilian Kapur air conditioning in the bedroom and throughout the house if possible.  . Avoid going out in dry windy days - especially early morning. . Pollen counts are highest between 5 - 10 AM and on dry, hot and windy days.  . Save outside activities for late afternoon or after a heavy rain, when pollen levels are lower.  . Avoid mowing of grass if you have grass pollen allergy. Marland Kitchen Be aware that pollen can also be transported indoors on people and pets.  . Dry your clothes in an automatic dryer rather than hanging them outside where they might collect pollen.  . Rinse hair and eyes before bedtime. Control of House Dust Mite Allergen . Dust mite allergens are a common trigger of allergy and asthma symptoms. While they can be found throughout the house, these microscopic creatures thrive in warm, humid environments such as bedding, upholstered furniture and carpeting. . Because so much time is spent in the bedroom, it is essential to reduce mite levels there.  . Encase pillows, mattresses, and box springs in special allergen-proof fabric covers or airtight, zippered plastic covers.  . Bedding should be washed weekly in hot water (130 F) and dried in  a hot dryer. Allergen-proof covers are available for comforters and pillows that can't be regularly washed.  Mackenzie Gomez the allergy-proof covers every few months. Minimize clutter in the bedroom. Keep pets out of the bedroom.  Marland Kitchen Keep humidity less than 50% by using a dehumidifier or air conditioning. You can buy a humidity measuring device called a hygrometer to monitor this.  . If possible, replace carpets with hardwood, linoleum, or washable area rugs. If that's not possible, vacuum frequently with a vacuum that has a HEPA filter. . Remove all upholstered furniture and non-washable window drapes from the bedroom. . Remove all non-washable stuffed toys from the bedroom.  Wash stuffed toys weekly. Pet Allergen Avoidance: . Contrary to popular opinion, there are no "hypoallergenic" breeds of dogs or cats. That is because people are not allergic to an animal's hair, but to an allergen found in the animal's saliva, dander (dead skin flakes) or urine. Pet allergy symptoms typically occur within minutes. For some people, symptoms can build up and become most severe 8 to 12 hours after contact with the animal. People with severe allergies can experience reactions in public places if dander has been transported on the pet owners' clothing. Marland Kitchen Keeping an animal outdoors is only a partial solution, since homes with pets in the yard still have higher concentrations of animal allergens. . Before getting a pet, ask your  allergist to determine if you are allergic to animals. If your pet is already considered part of your family, try to minimize contact and keep the pet out of the bedroom and other rooms where you spend a great deal of time. . As with dust mites, vacuum carpets often or replace carpet with a hardwood floor, tile or linoleum. . High-efficiency particulate air (HEPA) cleaners can reduce allergen levels over time. . While dander and saliva are the source of cat and dog allergens, urine is the source of  allergens from rabbits, hamsters, mice and [place] pigs; so ask a non-allergic family member to clean the animal's cage. . If you have a pet allergy, talk to your allergist about the potential for allergy immunotherapy (allergy shots). This strategy can often provide long-term relief. Mold Control . Mold and fungi can grow on a variety of surfaces provided certain temperature and moisture conditions exist.  . Outdoor molds grow on plants, decaying vegetation and soil. The major outdoor mold, Alternaria and Cladosporium, are found in very high numbers during hot and dry conditions. Generally, a late summer - fall peak is seen for common outdoor fungal spores. Rain will temporarily lower outdoor mold spore count, but counts rise rapidly when the rainy period ends. . The most important indoor molds are Aspergillus and Penicillium. Dark, humid and poorly ventilated basements are ideal sites for mold growth. The next most common sites of mold growth are the bathroom and the kitchen. Outdoor (Seasonal) Mold Control . Use air conditioning and keep windows closed. . Avoid exposure to decaying vegetation. Marland Kitchen Avoid leaf raking. . Avoid grain handling. . Consider wearing a face mask if working in moldy areas.  Indoor (Perennial) Mold Control  . Maintain humidity below 50%. . Get rid of mold growth on hard surfaces with water, detergent and, if necessary, 5% bleach (do not mix with other cleaners). Then dry the area completely. If mold covers an area more than 10 square feet, consider hiring an indoor environmental professional. . For clothing, washing with soap and water is best. If moldy items cannot be cleaned and dried, throw them away. . Remove sources e.g. contaminated carpets. . Repair and seal leaking roofs or pipes. Using dehumidifiers in damp basements may be helpful, but empty the water and clean units regularly to prevent mildew from forming. All rooms, especially basements, bathrooms and kitchens,  require ventilation and cleaning to deter mold and mildew growth. Avoid carpeting on concrete or damp floors, and storing items in damp areas. Cockroach Allergen Avoidance Cockroaches are often found in the homes of densely populated urban areas, schools or commercial buildings, but these creatures can lurk almost anywhere. This does not mean that you have a dirty house or living area. . Block all areas where roaches can enter the home. This includes crevices, wall cracks and windows.  . Cockroaches need water to survive, so fix and seal all leaky faucets and pipes. Have an exterminator go through the house when your family and pets are gone to eliminate any remaining roaches. Marland Kitchen Keep food in lidded containers and put pet food dishes away after your pets are done eating. Vacuum and sweep the floor after meals, and take out garbage and recyclables. Use lidded garbage containers in the kitchen. Wash dishes immediately after use and clean under stoves, refrigerators or toasters where crumbs can accumulate. Wipe off the stove and other kitchen surfaces and cupboards regularly.  Allergic conjunctivitis, bilateral  See as above for allergic rhinitis.  May  use pataday 1 drop once a day as needed.   Mild intermittent asthma Diagnosed with asthma 20 years ago.  Triggered by allergies and infections.  No recent albuterol use.  Had one course of prednisone this past 12 months.  Today spirometry was normal.  May use albuterol rescue inhaler 2 puffs or nebulizer every 4 to 6 hours as needed for shortness of breath, chest tightness, coughing, and wheezing. May use albuterol rescue inhaler 2 puffs 5 to 15 minutes prior to strenuous physical activities.  Monitor symptoms. If having a flare, let us know.  History of penicillin allergy Reaction as a child.  Unsure of specific events.  Continue to avoid penicillin and consider penicillin skin testing and challenge in the future.  Return in about 3 months  (around 10/26/2018).  Meds ordered this encounter  Medications  . mometasone (NASONEX) 50 MCG/ACT nasal spray    Sig: Place 2 sprays into the nose daily.    Dispense:  17 g    Refill:  5  . Crisaborole (EUCRISA) 2 % OINT    Sig: Apply 1 application topically 2 (two) times daily. To skin flare    Dispense:  100 g    Refill:  5  . Olopatadine HCl (PATADAY) 0.2 % SOLN    Sig: Place 1 drop into both eyes 1 day or 1 dose. As needed for itchy eyes    Dispense:  1 Bottle    Refill:  5    Other allergy screening: Asthma: yes  She reports symptoms of chest tightness, shortness of breath, wheezing for 20 years. Current medications include albuterol prn which help. She reports not using aerochamber with asthma inhalers. She tried the following inhalers: Advair Diskus. Main asthma triggers are allergies, infections. In the last month, frequency of asthma symptoms: 0x/week. Frequency of nocturnal symptoms: 0x/month. Frequency of SABA use: 0x/week. Interference with physical activity: no. Sleep is undisturbed. In the last 12 months, emergency room visits/urgent care visits/doctor office visits or hospitalizations due to asthma: in January went to urgent care as she had bronchitis. In the last 12 months, oral steroids courses: one course. Lifetime history of hospitalization for asthma: 0. Prior intubations: no. Asthma was diagnosed at age teenager by clinical history. History of pneumonia: yes. She was not evaluated by allergist/pulmonologist in the past. Smoking exposure: no. Up to date with flu vaccine: yes.  Rhino conjunctivitis: yes  She reports symptoms of itchy nose/eyes, rhinorrhea, sneezing, watery eyes, coughing, itchy throat. Symptoms have been going on for 30+ years. The symptoms are present all year around with worsening in fall and spring. Other triggers include exposure to none. Anosmia: no. Headache: yes. She has used zyrtec, xyzal, eye drops with some improvement in symptoms. Flonase caused  epistaxis and burning sensation. Sinus infections: a few this past year. Previous work up includes: none. Previous ENT evaluation: yes for ear issues. Previous sinus imaging: no.  Food allergy: no Medication allergy: yes  Penicillin - as a child unknown Latex - itchy, hives feuromoxytol - nausea, vomiting, chest pain Hymenoptera allergy: yes large localized reaction Urticaria: no Eczema:no History of recurrent infections suggestive of immunodeficency: no  Diagnostics: Spirometry:  Tracings reviewed. Her effort: Good reproducible efforts. FVC: 3.36 L FEV1: 3.09 L, 108 % predicted FEV1/FVC ratio: 92 % Interpretation: Spirometry consistent with normal pattern.  Please see scanned spirometry results for details.  Skin Testing: Positive test to: Trees, weeds, grass, ragweed, dust mite, cat, mold and cockroaches. Negative test to: Dog.  Results discussed  with patient/family. Airborne Adult Perc - 07/26/18 1431    Time Antigen Placed  1430    Allergen Manufacturer  Waynette Buttery    Location  Back    Number of Test  59    Panel 1  Select    1. Control-Buffer 50% Glycerol  Negative    2. Control-Histamine 1 mg/ml  2+    3. Albumin saline  Negative    4. Bahia  4+    5. [place]  4+    6. Johnson  4+    7. Kentucky Blue  4+    8. Meadow Fescue  4+    9. Perennial Rye  4+    10. Sweet Vernal  4+    11. Timothy  4+    12. Cocklebur  2+    13. Burweed Marshelder  3+    14. Ragweed, short  4+    15. Ragweed, Giant  4+    16. Plantain,  English  2+    17. Lamb's Quarters  Negative    18. Sheep Sorrell  Negative    19. Rough Pigweed  Negative    20. Marsh Elder, Rough  Negative    21. Mugwort, Common  Negative    22. Ash mix  Negative    23. Birch mix  Negative    24. Beech American  2+    25. Box, Elder  Negative    26. Cedar, red  Negative    27. Cottonwood, Guinea-Bissau  Negative    28. Elm mix  Negative    29. Hickory mix  Negative    30. Maple mix  2+    31. Oak, Guinea-Bissau mix   Negative    32. Pecan Pollen  4+    33. Pine mix  Negative    34. Sycamore Eastern  Negative    35. Walnut, Black Pollen  2+    36. Alternaria alternata  Negative    37. Cladosporium Herbarum  Negative    38. Aspergillus mix  Negative    39. Penicillium mix  Negative    40. Bipolaris sorokiniana (Helminthosporium)  Negative    41. Drechslera spicifera (Curvularia)  Negative    42. Mucor plumbeus  Negative    43. Fusarium moniliforme  Negative    44. Aureobasidium pullulans (pullulara)  Negative    45. Rhizopus oryzae  Negative    46. Botrytis cinera  Negative    47. Epicoccum nigrum  Negative    48. Phoma betae  Negative    49. Candida Albicans  Negative    50. Trichophyton mentagrophytes  Negative    51. Mite, D Farinae  5,000 AU/ml  4+    52. Mite, D Pteronyssinus  5,000 AU/ml  4+    53. Cat Hair 10,000 BAU/ml  2+    54.  Dog Epithelia  Negative    55. Mixed Feathers  Negative    56. Horse Epithelia  Negative    57. Cockroach, German  Negative    58. Mouse  Negative    59. Tobacco Leaf  Negative     Intradermal - 07/26/18 1458    Time Antigen Placed  1458    Allergen Manufacturer  Waynette Buttery    Location  Arm    Number of Test  7    Intradermal  Select    Control  Negative    Mold 1  Negative    Mold 2  2+    Mold 3  Negative    Mold 4  Negative    Dog  Negative    Cockroach  4+       Airborne Adult Perc - 07/26/18 1431    Time Antigen Placed  1430    Allergen Manufacturer  Waynette Buttery    Location  Back    Number of Test  59    Panel 1  Select    1. Control-Buffer 50% Glycerol  Negative    2. Control-Histamine 1 mg/ml  2+    3. Albumin saline  Negative    4. Bahia  4+    5. [place]  4+    6. Johnson  4+    7. Kentucky Blue  4+    8. Meadow Fescue  4+    9. Perennial Rye  4+    10. Sweet Vernal  4+    11. Timothy  4+    12. Cocklebur  2+    13. Burweed Marshelder  3+    14. Ragweed, short  4+    15. Ragweed, Giant  4+    16. Plantain,  English  2+    17. Lamb's  Quarters  Negative    18. Sheep Sorrell  Negative    19. Rough Pigweed  Negative    20. Marsh Elder, Rough  Negative    21. Mugwort, Common  Negative    22. Ash mix  Negative    23. Birch mix  Negative    24. Beech American  2+    25. Box, Elder  Negative    26. Cedar, red  Negative    27. Cottonwood, Guinea-Bissau  Negative    28. Elm mix  Negative    29. Hickory mix  Negative    30. Maple mix  2+    31. Oak, Guinea-Bissau mix  Negative    32. Pecan Pollen  4+    33. Pine mix  Negative    34. Sycamore Eastern  Negative    35. Walnut, Black Pollen  2+    36. Alternaria alternata  Negative    37. Cladosporium Herbarum  Negative    38. Aspergillus mix  Negative    39. Penicillium mix  Negative    40. Bipolaris sorokiniana (Helminthosporium)  Negative    41. Drechslera spicifera (Curvularia)  Negative    42. Mucor plumbeus  Negative    43. Fusarium moniliforme  Negative    44. Aureobasidium pullulans (pullulara)  Negative    45. Rhizopus oryzae  Negative    46. Botrytis cinera  Negative    47. Epicoccum nigrum  Negative    48. Phoma betae  Negative    49. Candida Albicans  Negative    50. Trichophyton mentagrophytes  Negative    51. Mite, D Farinae  5,000 AU/ml  4+    52. Mite, D Pteronyssinus  5,000 AU/ml  4+    53. Cat Hair 10,000 BAU/ml  2+    54.  Dog Epithelia  Negative    55. Mixed Feathers  Negative    56. Horse Epithelia  Negative    57. Cockroach, German  Negative    58. Mouse  Negative    59. Tobacco Leaf  Negative     Intradermal - 07/26/18 1458    Time Antigen Placed  1458    Allergen Manufacturer  Waynette Buttery    Location  Arm    Number of Test  7    Intradermal  Select  Control  Negative    Mold 1  Negative    Mold 2  2+    Mold 3  Negative    Mold 4  Negative    Dog  Negative    Cockroach  4+      Past Medical History: Patient Active Problem List   Diagnosis Date Noted  . Dermatitis 07/26/2018  . Perennial allergic rhinitis with seasonal variation 07/26/2018    . Allergic conjunctivitis, bilateral 07/26/2018  . Mild intermittent asthma 07/26/2018  . History of penicillin allergy 07/26/2018  . Bipolar depression (HCC) 02/13/2018  . Anemia 07/09/2014   Past Medical History:  Diagnosis Date  . Kidney stone   . Mental disorder   . Mild intermittent asthma 07/26/2018  . Recurrent upper respiratory infection (URI)   . Urinary tract infection    Past Surgical History: Past Surgical History:  Procedure Laterality Date  . NO PAST SURGERIES    . TYMPANOSTOMY TUBE PLACEMENT     Medication List:  Current Outpatient Medications  Medication Sig Dispense Refill  . albuterol (PROAIR HFA) 108 (90 Base) MCG/ACT inhaler Inhale 90 mcg into the lungs.    . busPIRone (BUSPAR) 5 MG tablet Take 1 tablet (5 mg total) by mouth 2 (two) times daily. 60 tablet 2  . cetirizine (ZYRTEC) 10 MG tablet Take 1 tablet (10 mg total) by mouth daily. 30 tablet 11  . escitalopram (LEXAPRO) 20 MG tablet TAKE ONE TABLET BY MOUTH DAILY 30 tablet 5  . ibuprofen (ADVIL,MOTRIN) 200 MG tablet Take 200 mg by mouth every 6 (six) hours as needed.    . montelukast (SINGULAIR) 10 MG tablet Take 10 mg by mouth daily.    Lennox Solders (EUCRISA) 2 % OINT Apply 1 application topically 2 (two) times daily. To skin flare 100 g 5  . mometasone (NASONEX) 50 MCG/ACT nasal spray Place 2 sprays into the nose daily. 17 g 5  . Olopatadine HCl (PATADAY) 0.2 % SOLN Place 1 drop into both eyes 1 day or 1 dose. As needed for itchy eyes 1 Bottle 5   No current facility-administered medications for this visit.    Allergies: Allergies  Allergen Reactions  . Ferumoxytol Shortness Of Breath    Nausea, vomiting, and chest pain Nausea, vomiting, and chest pain   . Latex Hives, Rash, Itching and Other (See Comments)    unknown   . Iron Dextran   . Penicillins Other (See Comments)    Unknown--childhood reaction   Social History: Social History   Socioeconomic History  . Marital status: Married     Spouse name: Not on file  . Number of children: Not on file  . Years of education: Not on file  . Highest education level: Not on file  Occupational History  . Not on file  Social Needs  . Financial resource strain: Not on file  . Food insecurity:    Worry: Not on file    Inability: Not on file  . Transportation needs:    Medical: Not on file    Non-medical: Not on file  Tobacco Use  . Smoking status: Never Smoker  . Smokeless tobacco: Never Used  Substance and Sexual Activity  . Alcohol use: No  . Drug use: No  . Sexual activity: Yes  Lifestyle  . Physical activity:    Days per week: Not on file    Minutes per session: Not on file  . Stress: Not on file  Relationships  . Social connections:  Talks on phone: Not on file    Gets together: Not on file    Attends religious service: Not on file    Active member of club or organization: Not on file    Attends meetings of clubs or organizations: Not on file    Relationship status: Not on file  Other Topics Concern  . Not on file  Social History Narrative  . Not on file   Lives in a 39+ year old home with son, daughter. Smoking: Denies Occupation: Adult nurse HistorySurveyor, minerals in the house: yes Carpet in the family room: Hardwood with area Web designer in the bedroom: yes Heating: Gas Cooling: Central Pet: yes 2 cats x 2 yrs, goes into bedroom  Family History: Family History  Problem Relation Age of Onset  . Diabetes Father   . Heart disease Father   . Hyperlipidemia Father   . Allergic rhinitis Father   . Eczema Father   . Cancer Maternal Grandmother   . Diabetes Paternal Grandfather   . Asthma Neg Hx   . Urticaria Neg Hx    Problem                               Relation Asthma                                   No  Eczema                                Mother  Food allergy                          No  Allergic rhino conjunctivitis     Father   Review of Systems   Constitutional: Negative for appetite change, chills, fever and unexpected weight change.  HENT: Positive for congestion. Negative for rhinorrhea.   Eyes: Positive for itching.  Respiratory: Negative for cough, chest tightness, shortness of breath and wheezing.   Cardiovascular: Negative for chest pain.  Gastrointestinal: Negative for abdominal pain.  Genitourinary: Negative for difficulty urinating.  Skin: Positive for rash.  Allergic/Immunologic: Positive for environmental allergies. Negative for food allergies.  Neurological: Positive for headaches.   Objective: BP 110/60 (BP Location: Left Arm, Patient Position: Sitting, Cuff Size: Normal)   Pulse 73   Temp 98.4 F (36.9 C) (Oral)   Resp 20   Ht 5\' 2"  (1.575 m)   Wt 109 lb (49.4 kg)   SpO2 97%   BMI 19.94 kg/m  Body mass index is 19.94 kg/m. Physical Exam  Constitutional: She is oriented to person, place, and time. She appears well-developed and well-nourished.  HENT:  Head: Normocephalic and atraumatic.  Right Ear: External ear normal.  Left Ear: External ear normal.  Mouth/Throat: Oropharynx is clear and moist.  Turbinate hypertrophy bilaterally.  Eyes: Conjunctivae and EOM are normal.  Neck: Neck supple.  Cardiovascular: Normal rate, regular rhythm and normal heart sounds. Exam reveals no gallop and no friction rub.  No murmur heard. Pulmonary/Chest: Effort normal and breath sounds normal. She has no wheezes. She has no rales.  Abdominal: Soft. Bowel sounds are normal. There is no tenderness.  Lymphadenopathy:    She has no cervical adenopathy.  Neurological: She is alert and oriented to person, place,  and time.  Skin: Skin is warm. Rash noted.  Tattoos throughout.  Patch of raised erythematous papular rash on the lower back region.  Psychiatric: She has a normal mood and affect. Her behavior is normal.  Nursing note and vitals reviewed.  The plan was reviewed with the patient/family, and all  questions/concerned were addressed.  It was my pleasure to see Kamisha today and participate in her care. Please feel free to contact me with any questions or concerns.  Sincerely,  Wyline Mood, DO [facility]

## 2018-07-26 NOTE — Assessment & Plan Note (Addendum)
Perennial rhinoconjunctivitis symptoms in the past 30+ years with worsening in the fall and spring.  Used over-the-counter antihistamines, eyedrops with some benefit. Flonase caused epistaxis and burning sensation.  Today's skin prick testing was positive to: trees, weeds, grass, ragweed, dust mite, cat, mold and cockroaches.  Start mometasone (Nasonex) 2 sprays once a day. Demonstrated proper use.   May use over the counter antihistamines such as Zyrtec (cetirizine), Claritin (loratadine), Allegra (fexofenadine), or Xyzal (levocetirizine) daily as needed.  Continue Singulair daily.  Provided information regarding allergy immunotherapy.  Discussed environments control measures. Reducing Pollen Exposure . Pollen seasons: trees (spring), grass (summer) and ragweed/weeds (fall). Marland Kitchen Keep windows closed in your home and car to lower pollen exposure.  Lilian Kapur air conditioning in the bedroom and throughout the house if possible.  . Avoid going out in dry windy days - especially early morning. . Pollen counts are highest between 5 - 10 AM and on dry, hot and windy days.  . Save outside activities for late afternoon or after a heavy rain, when pollen levels are lower.  . Avoid mowing of grass if you have grass pollen allergy. Marland Kitchen Be aware that pollen can also be transported indoors on people and pets.  . Dry your clothes in an automatic dryer rather than hanging them outside where they might collect pollen.  . Rinse hair and eyes before bedtime. Control of House Dust Mite Allergen . Dust mite allergens are a common trigger of allergy and asthma symptoms. While they can be found throughout the house, these microscopic creatures thrive in warm, humid environments such as bedding, upholstered furniture and carpeting. . Because so much time is spent in the bedroom, it is essential to reduce mite levels there.  . Encase pillows, mattresses, and box springs in special allergen-proof fabric covers or  airtight, zippered plastic covers.  . Bedding should be washed weekly in hot water (130 F) and dried in a hot dryer. Allergen-proof covers are available for comforters and pillows that can't be regularly washed.  Reyes Ivan the allergy-proof covers every few months. Minimize clutter in the bedroom. Keep pets out of the bedroom.  Marland Kitchen Keep humidity less than 50% by using a dehumidifier or air conditioning. You can buy a humidity measuring device called a hygrometer to monitor this.  . If possible, replace carpets with hardwood, linoleum, or washable area rugs. If that's not possible, vacuum frequently with a vacuum that has a HEPA filter. . Remove all upholstered furniture and non-washable window drapes from the bedroom. . Remove all non-washable stuffed toys from the bedroom.  Wash stuffed toys weekly. Pet Allergen Avoidance: . Contrary to popular opinion, there are no "hypoallergenic" breeds of dogs or cats. That is because people are not allergic to an animal's hair, but to an allergen found in the animal's saliva, dander (dead skin flakes) or urine. Pet allergy symptoms typically occur within minutes. For some people, symptoms can build up and become most severe 8 to 12 hours after contact with the animal. People with severe allergies can experience reactions in public places if dander has been transported on the pet owners' clothing. Marland Kitchen Keeping an animal outdoors is only a partial solution, since homes with pets in the yard still have higher concentrations of animal allergens. . Before getting a pet, ask your allergist to determine if you are allergic to animals. If your pet is already considered part of your family, try to minimize contact and keep the pet out of the bedroom and  other rooms where you spend a great deal of time. . As with dust mites, vacuum carpets often or replace carpet with a hardwood floor, tile or linoleum. . High-efficiency particulate air (HEPA) cleaners can reduce allergen levels  over time. . While dander and saliva are the source of cat and dog allergens, urine is the source of allergens from rabbits, hamsters, mice and [place] pigs; so ask a non-allergic family member to clean the animal's cage. . If you have a pet allergy, talk to your allergist about the potential for allergy immunotherapy (allergy shots). This strategy can often provide long-term relief. Mold Control . Mold and fungi can grow on a variety of surfaces provided certain temperature and moisture conditions exist.  . Outdoor molds grow on plants, decaying vegetation and soil. The major outdoor mold, Alternaria and Cladosporium, are found in very high numbers during hot and dry conditions. Generally, a late summer - fall peak is seen for common outdoor fungal spores. Rain will temporarily lower outdoor mold spore count, but counts rise rapidly when the rainy period ends. . The most important indoor molds are Aspergillus and Penicillium. Dark, humid and poorly ventilated basements are ideal sites for mold growth. The next most common sites of mold growth are the bathroom and the kitchen. Outdoor (Seasonal) Mold Control . Use air conditioning and keep windows closed. . Avoid exposure to decaying vegetation. Marland Kitchen Avoid leaf raking. . Avoid grain handling. . Consider wearing a face mask if working in moldy areas.  Indoor (Perennial) Mold Control  . Maintain humidity below 50%. . Get rid of mold growth on hard surfaces with water, detergent and, if necessary, 5% bleach (do not mix with other cleaners). Then dry the area completely. If mold covers an area more than 10 square feet, consider hiring an indoor environmental professional. . For clothing, washing with soap and water is best. If moldy items cannot be cleaned and dried, throw them away. . Remove sources e.g. contaminated carpets. . Repair and seal leaking roofs or pipes. Using dehumidifiers in damp basements may be helpful, but empty the water and clean units  regularly to prevent mildew from forming. All rooms, especially basements, bathrooms and kitchens, require ventilation and cleaning to deter mold and mildew growth. Avoid carpeting on concrete or damp floors, and storing items in damp areas. Cockroach Allergen Avoidance Cockroaches are often found in the homes of densely populated urban areas, schools or commercial buildings, but these creatures can lurk almost anywhere. This does not mean that you have a dirty house or living area. . Block all areas where roaches can enter the home. This includes crevices, wall cracks and windows.  . Cockroaches need water to survive, so fix and seal all leaky faucets and pipes. Have an exterminator go through the house when your family and pets are gone to eliminate any remaining roaches. Marland Kitchen Keep food in lidded containers and put pet food dishes away after your pets are done eating. Vacuum and sweep the floor after meals, and take out garbage and recyclables. Use lidded garbage containers in the kitchen. Wash dishes immediately after use and clean under stoves, refrigerators or toasters where crumbs can accumulate. Wipe off the stove and other kitchen surfaces and cupboards regularly.

## 2018-07-26 NOTE — Assessment & Plan Note (Addendum)
   See as above for allergic rhinitis.  May use pataday 1 drop once a day as needed.

## 2018-07-29 ENCOUNTER — Telehealth: Payer: Self-pay | Admitting: *Deleted

## 2018-07-29 NOTE — Telephone Encounter (Signed)
PA approval for Mometasone and Eucrisa. Forms have been faxed to pharmacy and placed in bulk scanning.

## 2018-08-04 ENCOUNTER — Other Ambulatory Visit: Payer: Self-pay | Admitting: Family Medicine

## 2018-08-04 DIAGNOSIS — F419 Anxiety disorder, unspecified: Secondary | ICD-10-CM

## 2018-08-06 ENCOUNTER — Other Ambulatory Visit: Payer: Self-pay | Admitting: Family Medicine

## 2018-08-09 ENCOUNTER — Telehealth: Payer: Self-pay

## 2018-08-13 NOTE — Telephone Encounter (Signed)
Patient wants to know if she can be put on birth control until she is referred to gyn

## 2018-08-14 ENCOUNTER — Other Ambulatory Visit: Payer: Self-pay | Admitting: Family Medicine

## 2018-08-14 MED ORDER — NORETHINDRONE ACET-ETHINYL EST 1-20 MG-MCG PO TABS: 1.0000 | ORAL_TABLET | Freq: Every day | ORAL | 11 refills | Status: DC

## 2018-08-14 NOTE — Telephone Encounter (Signed)
Patient notified

## 2018-08-14 NOTE — Progress Notes (Signed)
New Rx for birth control (Loestrin) sent to pharmacy today.

## 2018-08-14 NOTE — Telephone Encounter (Signed)
-----   Message from Kallie Locks, FNP sent at 08/14/2018  4:13 PM EDT ----- Regarding: "Birth Control" Please inform patient that New Rx for birth control (Loestrin) sent to pharmacy today.    Thank you.

## 2018-09-09 ENCOUNTER — Other Ambulatory Visit: Payer: Self-pay | Admitting: Family Medicine

## 2018-09-09 DIAGNOSIS — F419 Anxiety disorder, unspecified: Secondary | ICD-10-CM

## 2018-09-17 ENCOUNTER — Ambulatory Visit (INDEPENDENT_AMBULATORY_CARE_PROVIDER_SITE_OTHER): Payer: Medicaid Other | Admitting: Psychiatry

## 2018-09-17 ENCOUNTER — Encounter (HOSPITAL_COMMUNITY): Payer: Self-pay | Admitting: Psychiatry

## 2018-09-17 VITALS — BP 116/70 | HR 66 | Ht 62.0 in | Wt 116.6 lb

## 2018-09-17 DIAGNOSIS — F411 Generalized anxiety disorder: Secondary | ICD-10-CM | POA: Diagnosis not present

## 2018-09-17 DIAGNOSIS — F3130 Bipolar disorder, current episode depressed, mild or moderate severity, unspecified: Secondary | ICD-10-CM

## 2018-09-17 DIAGNOSIS — F419 Anxiety disorder, unspecified: Secondary | ICD-10-CM | POA: Diagnosis not present

## 2018-09-17 MED ORDER — LAMOTRIGINE 25 MG PO TABS: ORAL_TABLET | ORAL | 1 refills | Status: DC

## 2018-09-17 MED ORDER — BUSPIRONE HCL 5 MG PO TABS: 5.0000 mg | ORAL_TABLET | Freq: Two times a day (BID) | ORAL | 1 refills | Status: DC
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
## 2018-09-24 ENCOUNTER — Ambulatory Visit (INDEPENDENT_AMBULATORY_CARE_PROVIDER_SITE_OTHER): Payer: Medicaid Other | Admitting: Family Medicine

## 2018-09-24 ENCOUNTER — Encounter: Payer: Self-pay | Admitting: Family Medicine

## 2018-09-24 VITALS — BP 96/66 | HR 88 | Temp 98.4°F | Ht 62.0 in | Wt 114.0 lb

## 2018-09-24 DIAGNOSIS — R0602 Shortness of breath: Secondary | ICD-10-CM | POA: Diagnosis not present

## 2018-09-24 DIAGNOSIS — Z09 Encounter for follow-up examination after completed treatment for conditions other than malignant neoplasm: Secondary | ICD-10-CM

## 2018-09-24 DIAGNOSIS — R05 Cough: Secondary | ICD-10-CM | POA: Diagnosis not present

## 2018-09-24 DIAGNOSIS — R059 Cough, unspecified: Secondary | ICD-10-CM

## 2018-09-24 DIAGNOSIS — R0989 Other specified symptoms and signs involving the circulatory and respiratory systems: Secondary | ICD-10-CM

## 2018-09-24 MED ORDER — ALBUTEROL SULFATE (2.5 MG/3ML) 0.083% IN NEBU
2.5000 mg | INHALATION_SOLUTION | Freq: Once | RESPIRATORY_TRACT | Status: AC
  Administered 2018-09-24: 2.5 mg via RESPIRATORY_TRACT

## 2018-09-24 MED ORDER — IPRATROPIUM BROMIDE 0.02 % IN SOLN
0.5000 mg | Freq: Once | RESPIRATORY_TRACT | Status: AC
  Administered 2018-09-24: 0.5 mg via RESPIRATORY_TRACT

## 2018-09-24 MED ORDER — AZITHROMYCIN 1 G PO PACK: 1.0000 g | PACK | Freq: Once | ORAL | 0 refills | Status: AC

## 2018-09-24 NOTE — Patient Instructions (Signed)
Azithromycin tablets What is this medicine? AZITHROMYCIN (az ith roe MYE sin) is a macrolide antibiotic. It is used to treat or prevent certain kinds of bacterial infections. It will not work for colds, flu, or other viral infections. This medicine may be used for other purposes; ask your health care provider or pharmacist if you have questions. COMMON BRAND NAME(S): Zithromax, Zithromax Tri-Pak, Zithromax Z-Pak What should I tell my health care provider before I take this medicine? They need to know if you have any of these conditions: -kidney disease -liver disease -irregular heartbeat or heart disease -an unusual or allergic reaction to azithromycin, erythromycin, other macrolide antibiotics, foods, dyes, or preservatives -pregnant or trying to get pregnant -breast-feeding How should I use this medicine? Take this medicine by mouth with a full glass of water. Follow the directions on the prescription label. The tablets can be taken with food or on an empty stomach. If the medicine upsets your stomach, take it with food. Take your medicine at regular intervals. Do not take your medicine more often than directed. Take all of your medicine as directed even if you think your are better. Do not skip doses or stop your medicine early. Talk to your pediatrician regarding the use of this medicine in children. While this drug may be prescribed for children as young as 6 months for selected conditions, precautions do apply. Overdosage: If you think you have taken too much of this medicine contact a poison control center or emergency room at once. NOTE: This medicine is only for you. Do not share this medicine with others. What if I miss a dose? If you miss a dose, take it as soon as you can. If it is almost time for your next dose, take only that dose. Do not take double or extra doses. What may interact with this medicine? Do not take this medicine with any of the following  medications: -lincomycin This medicine may also interact with the following medications: -amiodarone -antacids -birth control pills -cyclosporine -digoxin -magnesium -nelfinavir -phenytoin -warfarin This list may not describe all possible interactions. Give your health care provider a list of all the medicines, herbs, non-prescription drugs, or dietary supplements you use. Also tell them if you smoke, drink alcohol, or use illegal drugs. Some items may interact with your medicine. What should I watch for while using this medicine? Tell your doctor or healthcare professional if your symptoms do not start to get better or if they get worse. Do not treat diarrhea with over the counter products. Contact your doctor if you have diarrhea that lasts more than 2 days or if it is severe and watery. This medicine can make you more sensitive to the sun. Keep out of the sun. If you cannot avoid being in the sun, wear protective clothing and use sunscreen. Do not use sun lamps or tanning beds/booths. What side effects may I notice from receiving this medicine? Side effects that you should report to your doctor or health care professional as soon as possible: -allergic reactions like skin rash, itching or hives, swelling of the face, lips, or tongue -confusion, nightmares or hallucinations -dark urine -difficulty breathing -hearing loss -irregular heartbeat or chest pain -pain or difficulty passing urine -redness, blistering, peeling or loosening of the skin, including inside the mouth -white patches or sores in the mouth -yellowing of the eyes or skin Side effects that usually do not require medical attention (report to your doctor or health care professional if they continue or are bothersome): -  diarrhea -dizziness, drowsiness -headache -stomach upset or vomiting -tooth discoloration -vaginal irritation This list may not describe all possible side effects. Call your doctor for medical advice  about side effects. You may report side effects to FDA at 1-800-FDA-1088. Where should I keep my medicine? Keep out of the reach of children. Store at room temperature between 15 and 30 degrees C (59 and 86 degrees F). Throw away any unused medicine after the expiration date. NOTE: This sheet is a summary. It may not cover all possible information. If you have questions about this medicine, talk to your doctor, pharmacist, or health care provider.  2018 Elsevier/Gold Standard (2015-11-30 15:26:03)  

## 2018-09-24 NOTE — Progress Notes (Signed)
Follow Up  Subjective:    Patient ID: Mackenzie Gomez, female    DOB: 10/06/1981, 37 y.o.   MRN: [id number]   Chief Complaint  Patient presents with  . Follow-up  . Cough  . Nasal Congestion  . Fever    HPI  [name] is a 37 year old female with a past medical history of UTI, Respiratory Infections, Asthma, Mental Disorder, and Kidney Stone. She is here today for follow up.   Current Status: Since her last office visit, she has c/o body aches, cough, congestion, and fevers. She has been having these symptoms for 4 days now. She has been using inhaler, Sudafed, antihistamines, and OTC cough medication, with minimal relief.   She denies chills, fatigue, recent infections, weight loss, and night sweats. She has not had any headaches, visual changes, dizziness, and falls. No chest pain, heart palpitations, cough and shortness of breath reported. No reports of GI problems such as nausea, vomiting, diarrhea, and constipation. She has no reports of blood in stools, dysuria and hematuria her anxiety, and denies suicidal ideations, homicidal ideations, or auditory hallucinations. She denies pain today.    Review of Systems  Constitutional: Negative.   HENT: Positive for congestion (chest ).   Eyes: Negative.   Respiratory: Positive for cough and shortness of breath.   Cardiovascular: Negative.   Gastrointestinal: Negative.   Endocrine: Negative.   Genitourinary: Negative.   Musculoskeletal: Negative.   Skin: Negative.   Allergic/Immunologic: Negative.   Neurological: Negative.   Hematological: Negative.   Psychiatric/Behavioral: Negative.    Objective:   Physical Exam  Constitutional: She is oriented to person, place, and time. She appears well-developed and well-nourished.  HENT:  Head: Normocephalic and atraumatic.  Right Ear: External ear normal.  Eyes: Pupils are equal, round, and reactive to light. Conjunctivae and EOM are normal.  Neck: Normal range of motion. Neck supple.   Cardiovascular: Normal rate, regular rhythm, normal heart sounds and intact distal pulses.  Pulmonary/Chest: Effort normal and breath sounds normal.  Abdominal: Soft. Bowel sounds are normal.  Musculoskeletal: Normal range of motion.  Neurological: She is alert and oriented to person, place, and time.  Skin: Skin is warm and dry.  Psychiatric: She has a normal mood and affect. Her behavior is normal. Judgment and thought content normal.  Nursing note and vitals reviewed.  Assessment & Plan:   1. Chest congestion We will initiate Z-Pack today.  2. Cough Stable.   3. SOB (shortness of breath) - albuterol (PROVENTIL) (2.5 MG/3ML) 0.083% nebulizer solution 2.5 mg - ipratropium (ATROVENT) nebulizer solution 0.5 mg  4. Follow up She will follow up in 6 months.   Meds ordered this encounter  Medications  . albuterol (PROVENTIL) (2.5 MG/3ML) 0.083% nebulizer solution 2.5 mg  . ipratropium (ATROVENT) nebulizer solution 0.5 mg  . azithromycin (ZITHROMAX) 1 g powder    Sig: Take 1 packet (1 g total) by mouth once for 1 dose.    Dispense:  1 each    Refill:  0   Raliegh IpNatalie Ura Hausen,  MSN, FNP-C Patient Care [facility] [address]  [place], [place] [id number] [phone]

## 2018-09-25 ENCOUNTER — Ambulatory Visit (HOSPITAL_COMMUNITY): Payer: Medicaid Other | Admitting: Licensed Clinical Social Worker

## 2018-10-14 ENCOUNTER — Ambulatory Visit (HOSPITAL_COMMUNITY): Payer: Medicaid Other | Admitting: Licensed Clinical Social Worker

## 2018-10-28 ENCOUNTER — Ambulatory Visit (INDEPENDENT_AMBULATORY_CARE_PROVIDER_SITE_OTHER): Payer: Medicaid Other | Admitting: Allergy

## 2018-10-28 ENCOUNTER — Encounter: Payer: Self-pay | Admitting: Allergy

## 2018-10-28 VITALS — BP 98/60 | HR 72 | Resp 16

## 2018-10-28 DIAGNOSIS — Z880 Allergy status to penicillin: Secondary | ICD-10-CM | POA: Diagnosis not present

## 2018-10-28 DIAGNOSIS — L309 Dermatitis, unspecified: Secondary | ICD-10-CM | POA: Diagnosis not present

## 2018-10-28 DIAGNOSIS — J302 Other seasonal allergic rhinitis: Secondary | ICD-10-CM

## 2018-10-28 DIAGNOSIS — J4520 Mild intermittent asthma, uncomplicated: Secondary | ICD-10-CM | POA: Diagnosis not present

## 2018-10-28 DIAGNOSIS — H1010 Acute atopic conjunctivitis, unspecified eye: Secondary | ICD-10-CM

## 2018-10-28 DIAGNOSIS — J3089 Other allergic rhinitis: Secondary | ICD-10-CM | POA: Diagnosis not present

## 2018-10-28 MED ORDER — EPINEPHRINE 0.3 MG/0.3ML IJ SOAJ: 0.3000 mg | Freq: Once | INTRAMUSCULAR | 2 refills | Status: DC

## 2018-10-28 MED ORDER — AZELASTINE HCL 0.1 % NA SOLN: 2.0000 | Freq: Two times a day (BID) | NASAL | 5 refills | Status: DC

## 2018-10-28 MED ORDER — EPINEPHRINE 0.3 MG/0.3ML IJ SOAJ: 0.3000 mg | Freq: Once | INTRAMUSCULAR | 1 refills | Status: AC

## 2018-10-28 NOTE — Patient Instructions (Addendum)
Dermatitis  Continue skin care measures.  Continue zyrtec 10mg  twice a day.  Perennial allergic rhinitis with seasonal variation  Start allergy injections.   Start azelastine 1-2 sprays twice a day as needed for runny nose.   May use over the counter antihistamines such as Zyrtec (cetirizine), Claritin (loratadine), Allegra (fexofenadine), or Xyzal (levocetirizine) daily as needed.  Continue Singulair daily.  Continue environments control measures.  May use pataday 1 drop once a day as needed.  Mild intermittent asthma . Daily controller medication(s): none . Prior to physical activity: May use albuterol rescue inhaler 2 puffs 5 to 15 minutes prior to strenuous physical activities. Marland Kitchen Rescue medications: May use albuterol rescue inhaler 2 puffs or nebulizer every 4 to 6 hours as needed for shortness of breath, chest tightness, coughing, and wheezing. Monitor frequency of use.  . During upper respiratory infections: Start Dulera 100 2 puffs twice a day for 1-2 weeks. . Asthma control goals:  o Full participation in all desired activities (may need albuterol before activity) o Albuterol use two times or less a week on average (not counting use with activity) o Cough interfering with sleep two times or less a month o Oral steroids no more than once a year o No hospitalizations  History of penicillin allergy  Continue to avoid penicillin and consider penicillin skin testing and challenge in the future.  Follow up in 3 months

## 2018-10-28 NOTE — Assessment & Plan Note (Signed)
Past history - Rash and pruritus for the past 5 years mainly on her torso.  Usually worse during the summer and fall.  90% resolution of symptoms with prednisone.  Tried over-the-counter antihistamines some benefit and topical steroid creams with no benefit. Interim history - doing better and used Eucrisa with some benefit. Noticed improvement with changing personal care products to fragrance free items.   Continue skin care measures.  May use Eucrisa as needed.

## 2018-10-28 NOTE — Assessment & Plan Note (Signed)
Past history - Perennial rhinoconjunctivitis symptoms in the past 30+ years with worsening in the fall and spring.  Used over-the-counter antihistamines, eyedrops with some benefit. Flonase caused epistaxis and burning sensation. 2019 skin prick testing was positive to: trees, weeds, grass, ragweed, dust mite, cat, mold and cockroaches. Interim history - symptoms not well controlled. Tried nasonex with no benefit.   Start allergy immunotherapy.  Start azelastine 1-2 sprays twice a day as needed for runny nose.   May use over the counter antihistamines such as Zyrtec (cetirizine), Claritin (loratadine), Allegra (fexofenadine), or Xyzal (levocetirizine) daily as needed.  Continue Singulair daily.  Continue environments control measures.  May use pataday 1 drop once a day as needed.

## 2018-10-28 NOTE — Assessment & Plan Note (Signed)
Past history - Diagnosed with asthma 20 years ago.  Triggered by allergies and infections.  No recent albuterol use.  Had one course of prednisone this past 12 months. Interim history - was doing well and had to use albuterol a few times during URI.  Today's spirometry was normal. . Daily controller medication(s): none . Prior to physical activity: May use albuterol rescue inhaler 2 puffs 5 to 15 minutes prior to strenuous physical activities. Marland Kitchen Rescue medications: May use albuterol rescue inhaler 2 puffs or nebulizer every 4 to 6 hours as needed for shortness of breath, chest tightness, coughing, and wheezing. Monitor frequency of use.  . During upper respiratory infections: Start Dulera 100 2 puffs twice a day for 1-2 weeks.

## 2018-10-28 NOTE — Assessment & Plan Note (Signed)
Past history - Reaction as a child.  Unsure of specific events.  Continue to avoid penicillin and consider penicillin skin testing and challenge in the future.

## 2018-10-28 NOTE — Progress Notes (Signed)
Follow Up Note  RE: Mackenzie Gomez MRN: [id number] DOB: 01/18/1981 Date of Office Visit: 10/28/2018  Referring provider: Kallie Locks, FNP Primary care provider: Kallie Locks, FNP  Chief Complaint: Allergies  History of Present Illness: I had the pleasure of seeing Mackenzie Gomez for a follow up visit at the [facility] on 10/28/2018. She is a 38 y.o. female, who is being followed for dermatitis, pruritus, allergic rhino conjunctivitis, asthma. Today she is here for regular follow up visit. Her previous allergy office visit was on 07/26/2018 with Dr. Selena Batten.   Dermatitis The rash and pruritus has improved since the last visit. Used Eucrisa as needed with some benefit. She switched her personal care products to fragrance free items with some benefit. Heat tends to flare symptoms.  Currently on zyrtec 10mg  BID not sure if this helped.  Allergic rhino conjunctivitis Tried Nasonex with no benefit and stopped. Using Vicks sinus spray with some benefit. Using eye drops as needed with some benefit. Still on Singulair daily.   Mild intermittent asthma Patient had URI and needed to use albuterol and nebulizer which helped.   Assessment and Plan: Mackenzie Gomez is a 38 y.o. female with: Seasonal and perennial allergic rhinoconjunctivitis Past history - Perennial rhinoconjunctivitis symptoms in the past 30+ years with worsening in the fall and spring.  Used over-the-counter antihistamines, eyedrops with some benefit. Flonase caused epistaxis and burning sensation. 2019 skin prick testing was positive to: trees, weeds, grass, ragweed, dust mite, cat, mold and cockroaches. Interim history - symptoms not well controlled. Tried nasonex with no benefit.   Start allergy immunotherapy.  Start azelastine 1-2 sprays twice a day as needed for runny nose.   May use over the counter antihistamines such as Zyrtec (cetirizine), Claritin (loratadine), Allegra (fexofenadine), or Xyzal (levocetirizine)  daily as needed.  Continue Singulair daily.  Continue environments control measures.  May use pataday 1 drop once a day as needed.  Mild intermittent asthma Past history - Diagnosed with asthma 20 years ago.  Triggered by allergies and infections.  No recent albuterol use.  Had one course of prednisone this past 12 months. Interim history - was doing well and had to use albuterol a few times during URI.  Today's spirometry was normal. . Daily controller medication(s): none . Prior to physical activity: May use albuterol rescue inhaler 2 puffs 5 to 15 minutes prior to strenuous physical activities. Marland Kitchen Rescue medications: May use albuterol rescue inhaler 2 puffs or nebulizer every 4 to 6 hours as needed for shortness of breath, chest tightness, coughing, and wheezing. Monitor frequency of use.  . During upper respiratory infections: Start Dulera 100 2 puffs twice a day for 1-2 weeks.  History of penicillin allergy Past history - Reaction as a child.  Unsure of specific events.  Continue to avoid penicillin and consider penicillin skin testing and challenge in the future.  Dermatitis Past history - Rash and pruritus for the past 5 years mainly on her torso.  Usually worse during the summer and fall.  90% resolution of symptoms with prednisone.  Tried over-the-counter antihistamines some benefit and topical steroid creams with no benefit. Interim history - doing better and used Eucrisa with some benefit. Noticed improvement with changing personal care products to fragrance free items.   Continue skin care measures.  May use Eucrisa as needed.  Return in about 3 months (around 01/27/2019).  Meds ordered this encounter  Medications  . azelastine (ASTELIN) 0.1 % nasal spray  Sig: Place 2 sprays into both nostrils 2 (two) times daily.    Dispense:  30 mL    Refill:  5  . DISCONTD: EPINEPHrine (AUVI-Q) 0.3 mg/0.3 mL IJ SOAJ injection    Sig: Inject 0.3 mLs (0.3 mg total) into the  muscle once for 1 dose. As directed for life-threatening allergic reactions    Dispense:  4 Device    Refill:  2    Generic mylan  . EPINEPHrine 0.3 mg/0.3 mL IJ SOAJ injection    Sig: Inject 0.3 mLs (0.3 mg total) into the muscle once for 1 dose.    Dispense:  2 Device    Refill:  1    mylan generic EpiPen   Diagnostics: Spirometry:  Tracings reviewed. Her effort: It was hard to get consistent efforts and there is a question as to whether this reflects a maximal maneuver. FVC: 3.20L FEV1: 3.06L, 107% predicted FEV1/FVC ratio: 96% Interpretation: No overt abnormalities noted given today's efforts.  Please see scanned spirometry results for details.  Medication List:  Current Outpatient Medications  Medication Sig Dispense Refill  . albuterol (PROAIR HFA) 108 (90 Base) MCG/ACT inhaler Inhale 90 mcg into the lungs.    . busPIRone (BUSPAR) 5 MG tablet Take 1 tablet (5 mg total) by mouth 2 (two) times daily. 60 tablet 1  . cetirizine (ZYRTEC) 10 MG tablet Take 1 tablet (10 mg total) by mouth daily. 30 tablet 11  . Crisaborole (EUCRISA) 2 % OINT Apply 1 application topically 2 (two) times daily. To skin flare 100 g 5  . escitalopram (LEXAPRO) 20 MG tablet TAKE ONE TABLET BY MOUTH DAILY 30 tablet 5  . ibuprofen (ADVIL,MOTRIN) 200 MG tablet Take 200 mg by mouth every 6 (six) hours as needed.    . lamoTRIgine (LAMICTAL) 25 MG tablet Take one tab daily for 2 weeks and than 2 tab daily 60 tablet 1  . montelukast (SINGULAIR) 10 MG tablet Take 10 mg by mouth daily.    . norethindrone-ethinyl estradiol (LOESTRIN 1/20, 21,) 1-20 MG-MCG tablet Take 1 tablet by mouth daily. 1 Package 11  . azelastine (ASTELIN) 0.1 % nasal spray Place 2 sprays into both nostrils 2 (two) times daily. 30 mL 5  . EPINEPHrine 0.3 mg/0.3 mL IJ SOAJ injection Inject 0.3 mLs (0.3 mg total) into the muscle once for 1 dose. 2 Device 1   No current facility-administered medications for this visit.    Allergies: Allergies    Allergen Reactions  . Ferumoxytol Shortness Of Breath    Nausea, vomiting, and chest pain Nausea, vomiting, and chest pain   . Latex Hives, Rash, Itching and Other (See Comments)    unknown   . Iron Dextran   . Penicillins Other (See Comments)    Unknown--childhood reaction   I reviewed her past medical history, social history, family history, and environmental history and no significant changes have been reported from previous visit on 07/26/2018.  Review of Systems  Constitutional: Negative for appetite change, chills, fever and unexpected weight change.  HENT: Positive for congestion. Negative for rhinorrhea.   Eyes: Positive for itching.  Respiratory: Negative for cough, chest tightness, shortness of breath and wheezing.   Cardiovascular: Negative for chest pain.  Gastrointestinal: Negative for abdominal pain.  Genitourinary: Negative for difficulty urinating.  Skin: Positive for rash.  Allergic/Immunologic: Positive for environmental allergies. Negative for food allergies.  Neurological: Positive for headaches.   Objective: BP 98/60   Pulse 72   Resp 16  There is  no height or weight on file to calculate BMI. Physical Exam  Constitutional: She is oriented to person, place, and time. She appears well-developed and well-nourished.  HENT:  Head: Normocephalic and atraumatic.  Right Ear: External ear normal.  Left Ear: External ear normal.  Mouth/Throat: Oropharynx is clear and moist.  Turbinate hypertrophy bilaterally.  Eyes: Conjunctivae and EOM are normal.  Neck: Neck supple.  Cardiovascular: Normal rate, regular rhythm and normal heart sounds. Exam reveals no gallop and no friction rub.  No murmur heard. Pulmonary/Chest: Effort normal and breath sounds normal. She has no wheezes. She has no rales.  Abdominal: Soft.  Lymphadenopathy:    She has no cervical adenopathy.  Neurological: She is alert and oriented to person, place, and time.  Skin: Skin is warm. No rash  noted.  Psychiatric: She has a normal mood and affect. Her behavior is normal.  Nursing note and vitals reviewed.  Previous notes and tests were reviewed. The plan was reviewed with the patient/family, and all questions/concerned were addressed.  It was my pleasure to see Yarenis today and participate in her care. Please feel free to contact me with any questions or concerns.  Sincerely,  Wyline Mood, DO Allergy & Immunology  [facility] office: [phone] [facility] office: [phone]

## 2018-10-30 NOTE — Progress Notes (Signed)
VIALS EXP 10-31-2019

## 2018-10-31 DIAGNOSIS — J301 Allergic rhinitis due to pollen: Secondary | ICD-10-CM | POA: Diagnosis not present

## 2018-11-01 DIAGNOSIS — J3089 Other allergic rhinitis: Secondary | ICD-10-CM | POA: Diagnosis not present

## 2018-11-05 ENCOUNTER — Ambulatory Visit (INDEPENDENT_AMBULATORY_CARE_PROVIDER_SITE_OTHER): Payer: Medicaid Other | Admitting: Psychiatry

## 2018-11-05 ENCOUNTER — Encounter (HOSPITAL_COMMUNITY): Payer: Self-pay | Admitting: Psychiatry

## 2018-11-05 DIAGNOSIS — F411 Generalized anxiety disorder: Secondary | ICD-10-CM

## 2018-11-05 DIAGNOSIS — F419 Anxiety disorder, unspecified: Secondary | ICD-10-CM | POA: Diagnosis not present

## 2018-11-05 DIAGNOSIS — F3130 Bipolar disorder, current episode depressed, mild or moderate severity, unspecified: Secondary | ICD-10-CM | POA: Diagnosis not present

## 2018-11-05 MED ORDER — BUSPIRONE HCL 5 MG PO TABS: 5.0000 mg | ORAL_TABLET | Freq: Two times a day (BID) | ORAL | 1 refills | Status: DC

## 2018-11-05 MED ORDER — LAMOTRIGINE 100 MG PO TABS: 100.0000 mg | ORAL_TABLET | Freq: Every day | ORAL | 1 refills | Status: DC

## 2018-11-05 NOTE — Progress Notes (Addendum)
[place] MD/PA/NP OP Progress Note  11/05/2018 3:39 PM Mackenzie Gomez  MRN:  [id number]  Chief Complaint: I like new medication.  It is helping my mood and irritability.  I am sleeping better.  HPI: Mackenzie Gomez came for her follow-up appointment.  She is a 38 year old Caucasian divorced female who was seen first time on December 3.  She is having a lot of irritability, mood swing, impulsive behavior with highs and lows and racing thoughts.  She was taking Lexapro but continued to have symptoms.  She noticed rambling speech and burst of energy with a feeling that she will conquer the world.  We had discussed about possibility of bipolar disorder.  Recommended to try Lamictal and she is now taking 50 mg every day.  She noticed improvement in her mood irritability but she continues to have anger outburst.  Since taking the medication she has not involved in self abusive behavior or cutting her self.  She regret not able to see the therapist because she had a flu but she had appointment in coming weeks.  Her sleep is much better.  Since taking the medication she had not missed the day.  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  She lives with HER-2 kids.  Patient denies any paranoia, hallucination or any suicidal thoughts.  She is no longer taking Lexapro.  She is taking BuSpar only 5 mg at bedtime.  Sometimes she feels anxious and nervous but denies any panic attack.  Her energy level is good.  Patient denies drinking or using any illegal substances.  Visit Diagnosis:    ICD-10-CM   1. Bipolar I disorder, most recent episode depressed (HCC) F31.30 lamoTRIgine (LAMICTAL) 100 MG tablet  2. Anxiety F41.9 busPIRone (BUSPAR) 5 MG tablet  3. GAD (generalized anxiety disorder) F41.1     Past Psychiatric History: Reviewed. Depression since high school.  Tried Ritalin to help her focus but stopped after 1 year.  Seen therapist on and off for cutting herself.  Tried Prozac, Vistaril,  Lexapro, Seroquel and trazodone but stopped as it causes excessive sedation.  No history of suicidal attempt or psychiatric inpatient treatment.  History of mood swing, depression, anxiety and poor pulse control with excessive spending.  Was taking Lexapro but when switched to Lamictal she stopped.  Past Medical History:  Past Medical History:  Diagnosis Date  . Kidney stone   . Mental disorder   . Mild intermittent asthma 07/26/2018  . Recurrent upper respiratory infection (URI)   . Urinary tract infection     Past Surgical History:  Procedure Laterality Date  . NO PAST SURGERIES    . TYMPANOSTOMY TUBE PLACEMENT      Family Psychiatric History: Reviewed.  Family History:  Family History  Problem Relation Age of Onset  . Diabetes Father   . Heart disease Father   . Hyperlipidemia Father   . Allergic rhinitis Father   . Eczema Father   . Cancer Maternal Grandmother   . Diabetes Paternal Grandfather   . Asthma Neg Hx   . Urticaria Neg Hx     Social History:  Social History   Socioeconomic History  . Marital status: Married    Spouse name: Not on file  . Number of children: Not on file  . Years of education: Not on file  . Highest education level: Not on file  Occupational History  . Not on file  Social Needs  . Financial resource strain: Not on  file  . Food insecurity:    Worry: Not on file    Inability: Not on file  . Transportation needs:    Medical: Not on file    Non-medical: Not on file  Tobacco Use  . Smoking status: Never Smoker  . Smokeless tobacco: Never Used  Substance and Sexual Activity  . Alcohol use: No  . Drug use: No  . Sexual activity: Yes  Lifestyle  . Physical activity:    Days per week: Not on file    Minutes per session: Not on file  . Stress: Not on file  Relationships  . Social connections:    Talks on phone: Not on file    Gets together: Not on file    Attends religious service: Not on file    Active member of club or  organization: Not on file    Attends meetings of clubs or organizations: Not on file    Relationship status: Not on file  Other Topics Concern  . Not on file  Social History Narrative  . Not on file    Allergies:  Allergies  Allergen Reactions  . Ferumoxytol Shortness Of Breath    Nausea, vomiting, and chest pain Nausea, vomiting, and chest pain   . Latex Hives, Rash, Itching and Other (See Comments)    unknown   . Iron Dextran   . Penicillins Other (See Comments)    Unknown--childhood reaction    Metabolic Disorder Labs: Lab Results  Component Value Date   HGBA1C 5.2 12/19/2017   No results found for: PROLACTIN No results found for: CHOL, TRIG, HDL, CHOLHDL, VLDL, LDLCALC Lab Results  Component Value Date   TSH 2.490 12/19/2017    Therapeutic Level Labs: No results found for: LITHIUM No results found for: VALPROATE No components found for:  CBMZ  Current Medications: Current Outpatient Medications  Medication Sig Dispense Refill  . albuterol (PROAIR HFA) 108 (90 Base) MCG/ACT inhaler Inhale 90 mcg into the lungs.    Marland Kitchen azelastine (ASTELIN) 0.1 % nasal spray Place 2 sprays into both nostrils 2 (two) times daily. 30 mL 5  . busPIRone (BUSPAR) 5 MG tablet Take 1 tablet (5 mg total) by mouth 2 (two) times daily. 60 tablet 1  . cetirizine (ZYRTEC) 10 MG tablet Take 1 tablet (10 mg total) by mouth daily. 30 tablet 11  . Crisaborole (EUCRISA) 2 % OINT Apply 1 application topically 2 (two) times daily. To skin flare 100 g 5  . ibuprofen (ADVIL,MOTRIN) 200 MG tablet Take 200 mg by mouth every 6 (six) hours as needed.    . lamoTRIgine (LAMICTAL) 25 MG tablet Take one tab daily for 2 weeks and than 2 tab daily 60 tablet 1  . norethindrone-ethinyl estradiol (LOESTRIN 1/20, 21,) 1-20 MG-MCG tablet Take 1 tablet by mouth daily. 1 Package 11  . escitalopram (LEXAPRO) 20 MG tablet TAKE ONE TABLET BY MOUTH DAILY (Patient not taking: Reported on 11/05/2018) 30 tablet 5  .  montelukast (SINGULAIR) 10 MG tablet Take 10 mg by mouth daily.     No current facility-administered medications for this visit.      Musculoskeletal: Strength & Muscle Tone: within normal limits Gait & Station: normal Patient leans: N/A  Psychiatric Specialty Exam: ROS  Blood pressure 110/68, height '5\' 2"'  (1.575 m), weight 115 lb (52.2 kg), unknown if currently breastfeeding.Body mass index is 21.03 kg/m.  General Appearance: Casual  Eye Contact:  Good  Speech:  Clear and Coherent  Volume:  Normal  Mood:  Euthymic  Affect:  Appropriate  Thought Process:  Descriptions of Associations: Intact  Orientation:  Full (Time, Place, and Person)  Thought Content: Logical   Suicidal Thoughts:  No  Homicidal Thoughts:  No  Memory:  Immediate;   Good Recent;   Good Remote;   Good  Judgement:  Good  Insight:  Good  Psychomotor Activity:  Normal  Concentration:  Concentration: Fair and Attention Span: Fair  Recall:  Good  Fund of Knowledge: Good  Language: Good  Akathisia:  No  Handed:  Right  AIMS (if indicated): not done  Assets:  Communication Skills Desire for [place] Talents/Skills  ADL's:  Intact  Cognition: WNL  Sleep:  improved   Screenings: GAD-7     Office Visit from 02/08/2018 in [place]  Total GAD-7 Score  13    PHQ2-9     Office Visit from 09/24/2018 in [facility] Office Visit from 07/02/2018 in [place] Office Visit from 05/01/2018 in [facility] Office Visit from 03/08/2018 in [facility] Office Visit from 02/08/2018 in [place]  PHQ-2 Total Score  '2  3  3  3  5  ' PHQ-9 Total Score  '8  8  15  13  15       ' Assessment and Plan: Bipolar disorder type I.  Generalized anxiety disorder.  Patient doing better since Lamictal started.  She has no rash, itching or tremors.  She is not involved in any  self abusive behavior.  She still have irritability and highs and lows and anger.  Recommended to try Lamictal 100 mg daily.  Reminded that if she had a rash then she need to stop the medication immediately.  Discontinue Lexapro as patient is no longer taking it.  Recommended to take BuSpar 5 mg twice a day to help her anxiety.  Encouraged to give appointment with a therapist.  Recommended to call us back if is any question, concern if you feel worsening of the symptoms.  Follow-up in 2 months.   Kathlee Nations, MD 11/05/2018, 3:39 PM

## 2018-11-12 ENCOUNTER — Ambulatory Visit (INDEPENDENT_AMBULATORY_CARE_PROVIDER_SITE_OTHER): Payer: Medicaid Other | Admitting: *Deleted

## 2018-11-12 DIAGNOSIS — J309 Allergic rhinitis, unspecified: Secondary | ICD-10-CM

## 2018-11-12 NOTE — Progress Notes (Signed)
Immunotherapy   Patient Details  Name: Mackenzie Gomez MRN: [id number] Date of Birth: Jul 28, 1981  11/12/2018  [name] started injections for  Grass-Ragweed-Weed-Tree & Mold-Cat-Cockroach-Dmite. Following schedule: B  Frequency:2 times per week Epi-Pen:Epi-Pen Available  Consent signed and patient instructions given. No problems after 30 minutes in the office.   Mariane Duval 11/12/2018, 2:03 PM

## 2018-11-14 ENCOUNTER — Ambulatory Visit (INDEPENDENT_AMBULATORY_CARE_PROVIDER_SITE_OTHER): Payer: Medicaid Other | Admitting: *Deleted

## 2018-11-14 DIAGNOSIS — J309 Allergic rhinitis, unspecified: Secondary | ICD-10-CM | POA: Diagnosis not present

## 2018-11-19 ENCOUNTER — Ambulatory Visit (INDEPENDENT_AMBULATORY_CARE_PROVIDER_SITE_OTHER): Payer: Medicaid Other | Admitting: *Deleted

## 2018-11-19 DIAGNOSIS — J309 Allergic rhinitis, unspecified: Secondary | ICD-10-CM | POA: Diagnosis not present

## 2018-11-21 ENCOUNTER — Ambulatory Visit (INDEPENDENT_AMBULATORY_CARE_PROVIDER_SITE_OTHER): Payer: Medicaid Other

## 2018-11-21 DIAGNOSIS — J309 Allergic rhinitis, unspecified: Secondary | ICD-10-CM | POA: Diagnosis not present

## 2018-11-26 ENCOUNTER — Ambulatory Visit (INDEPENDENT_AMBULATORY_CARE_PROVIDER_SITE_OTHER): Payer: Medicaid Other | Admitting: *Deleted

## 2018-11-26 DIAGNOSIS — J309 Allergic rhinitis, unspecified: Secondary | ICD-10-CM | POA: Diagnosis not present

## 2018-11-28 ENCOUNTER — Ambulatory Visit (INDEPENDENT_AMBULATORY_CARE_PROVIDER_SITE_OTHER): Payer: Medicaid Other | Admitting: *Deleted

## 2018-11-28 ENCOUNTER — Ambulatory Visit (INDEPENDENT_AMBULATORY_CARE_PROVIDER_SITE_OTHER): Payer: Medicaid Other | Admitting: Licensed Clinical Social Worker

## 2018-11-28 DIAGNOSIS — J309 Allergic rhinitis, unspecified: Secondary | ICD-10-CM

## 2018-11-28 DIAGNOSIS — F3130 Bipolar disorder, current episode depressed, mild or moderate severity, unspecified: Secondary | ICD-10-CM | POA: Diagnosis not present
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
## 2018-11-28 NOTE — Progress Notes (Deleted)
1 mo bottle of wine in one sitting

## 2018-12-03 ENCOUNTER — Ambulatory Visit (INDEPENDENT_AMBULATORY_CARE_PROVIDER_SITE_OTHER): Payer: Medicaid Other | Admitting: *Deleted

## 2018-12-03 DIAGNOSIS — J309 Allergic rhinitis, unspecified: Secondary | ICD-10-CM

## 2018-12-10 ENCOUNTER — Ambulatory Visit (INDEPENDENT_AMBULATORY_CARE_PROVIDER_SITE_OTHER): Payer: Medicaid Other | Admitting: *Deleted

## 2018-12-10 DIAGNOSIS — J309 Allergic rhinitis, unspecified: Secondary | ICD-10-CM | POA: Diagnosis not present

## 2018-12-12 ENCOUNTER — Ambulatory Visit (INDEPENDENT_AMBULATORY_CARE_PROVIDER_SITE_OTHER): Payer: Medicaid Other | Admitting: *Deleted

## 2018-12-12 DIAGNOSIS — J309 Allergic rhinitis, unspecified: Secondary | ICD-10-CM

## 2018-12-18 ENCOUNTER — Ambulatory Visit (INDEPENDENT_AMBULATORY_CARE_PROVIDER_SITE_OTHER): Payer: Medicaid Other | Admitting: *Deleted

## 2018-12-18 DIAGNOSIS — J309 Allergic rhinitis, unspecified: Secondary | ICD-10-CM | POA: Diagnosis not present

## 2018-12-20 ENCOUNTER — Ambulatory Visit (INDEPENDENT_AMBULATORY_CARE_PROVIDER_SITE_OTHER): Payer: Medicaid Other | Admitting: *Deleted

## 2018-12-20 DIAGNOSIS — J309 Allergic rhinitis, unspecified: Secondary | ICD-10-CM

## 2018-12-30 ENCOUNTER — Ambulatory Visit (INDEPENDENT_AMBULATORY_CARE_PROVIDER_SITE_OTHER): Payer: Medicaid Other | Admitting: *Deleted

## 2018-12-30 DIAGNOSIS — J309 Allergic rhinitis, unspecified: Secondary | ICD-10-CM | POA: Diagnosis not present

## 2019-01-02 ENCOUNTER — Ambulatory Visit (INDEPENDENT_AMBULATORY_CARE_PROVIDER_SITE_OTHER): Payer: Medicaid Other

## 2019-01-02 DIAGNOSIS — J309 Allergic rhinitis, unspecified: Secondary | ICD-10-CM | POA: Diagnosis not present

## 2019-01-07 ENCOUNTER — Encounter (HOSPITAL_COMMUNITY): Payer: Self-pay | Admitting: Psychiatry

## 2019-01-07 ENCOUNTER — Ambulatory Visit (INDEPENDENT_AMBULATORY_CARE_PROVIDER_SITE_OTHER): Payer: Medicaid Other | Admitting: *Deleted

## 2019-01-07 ENCOUNTER — Ambulatory Visit (INDEPENDENT_AMBULATORY_CARE_PROVIDER_SITE_OTHER): Payer: Medicaid Other | Admitting: Psychiatry

## 2019-01-07 ENCOUNTER — Other Ambulatory Visit: Payer: Self-pay

## 2019-01-07 ENCOUNTER — Telehealth: Payer: Self-pay | Admitting: *Deleted

## 2019-01-07 VITALS — BP 105/73 | HR 64 | Temp 98.2°F | Ht 62.0 in | Wt 114.0 lb

## 2019-01-07 DIAGNOSIS — F419 Anxiety disorder, unspecified: Secondary | ICD-10-CM | POA: Diagnosis not present

## 2019-01-07 DIAGNOSIS — F3130 Bipolar disorder, current episode depressed, mild or moderate severity, unspecified: Secondary | ICD-10-CM | POA: Diagnosis not present

## 2019-01-07 DIAGNOSIS — F5101 Primary insomnia: Secondary | ICD-10-CM

## 2019-01-07 DIAGNOSIS — J309 Allergic rhinitis, unspecified: Secondary | ICD-10-CM | POA: Diagnosis not present

## 2019-01-07 MED ORDER — LAMOTRIGINE 150 MG PO TABS: 150.0000 mg | ORAL_TABLET | Freq: Every day | ORAL | 1 refills | Status: DC

## 2019-01-07 MED ORDER — ESZOPICLONE 2 MG PO TABS: 2.0000 mg | ORAL_TABLET | Freq: Every evening | ORAL | 0 refills | Status: DC | PRN

## 2019-01-07 MED ORDER — BUSPIRONE HCL 10 MG PO TABS: 10.0000 mg | ORAL_TABLET | Freq: Two times a day (BID) | ORAL | 1 refills | Status: DC

## 2019-01-07 NOTE — Telephone Encounter (Signed)
Patient came in today and states she is very congested in her nose. She has been using her nasal spray but does not believe it is getting in her nose since it is so congested. She has been using her nedipot and also says nothing to get through. Is there anything we can give her to help? Please advise.

## 2019-01-07 NOTE — Progress Notes (Addendum)
BH MD/PA/NP OP Progress Note  01/07/2019 3:54 PM Mackenzie Gomez  MRN:  [id number]  Chief Complaint: I have anxiety.  I broke up with my boyfriend.  HPI: Patient came for her appointment.  She is taking Lamictal 100 mg and BuSpar 5 mg twice a day.  She endorsed increased anxiety and nervousness because of home schooling.  She has son and daughter who are in kindergarten and second grade.  She is doing home schooling.  Recently she broke up with the boyfriend.  Initially she was feeling overwhelmed but now things are getting better.  She is trying to adjust with new environment related to pandemic coronavirus.  She quit her last job but she is able to get a full-time job at [employer] but waiting to start soon.  Due to pandemic her starting date is pushed.  She like her current medication especially Lamictal.  She denies any recent mania, psychosis or any ups and downs.  She still struggle with anxiety and insomnia but her mood is improved.  She denies any hallucination, self abusive behavior or any cutting.  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  Her appetite is okay.  Energy level is good.  She denies any rash or any shakes.  Visit Diagnosis:    ICD-10-CM   1. Primary insomnia F51.01 eszopiclone (LUNESTA) 2 MG TABS tablet  2. Bipolar I disorder, most recent episode depressed (HCC) F31.30 lamoTRIgine (LAMICTAL) 150 MG tablet  3. Anxiety F41.9 busPIRone (BUSPAR) 10 MG tablet    Past Psychiatric History: Reviewed. Depression since high school. Tried Ritalin to help her focus but stopped after 1 year. Seen therapist on and off for cutting herself. Tried Prozac, Vistaril, Lexapro, Seroquel and trazodone but stopped as it causes excessive sedation. No history of suicidal attempt or psychiatric inpatient treatment. History of mood swing, depression, anxiety and poor pulse control with excessive spending.  Was taking Lexapro but when  switched to Lamictal she stopped.  Past Medical History:  Past Medical History:  Diagnosis Date  . Kidney stone   . Mental disorder   . Mild intermittent asthma 07/26/2018  . Recurrent upper respiratory infection (URI)   . Urinary tract infection     Past Surgical History:  Procedure Laterality Date  . NO PAST SURGERIES    . TYMPANOSTOMY TUBE PLACEMENT      Family Psychiatric History: Reviewed.  Family History:  Family History  Problem Relation Age of Onset  . Diabetes Father   . Heart disease Father   . Hyperlipidemia Father   . Allergic rhinitis Father   . Eczema Father   . Cancer Maternal Grandmother   . Diabetes Paternal Grandfather   . Asthma Neg Hx   . Urticaria Neg Hx     Social History:  Social History   Socioeconomic History  . Marital status: Married    Spouse name: Not on file  . Number of children: Not on file  . Years of education: Not on file  . Highest education level: Not on file  Occupational History  . Not on file  Social Needs  . Financial resource strain: Not on file  . Food insecurity:    Worry: Not on file    Inability: Not on file  . Transportation needs:    Medical: Not on file    Non-medical: Not on file  Tobacco Use  . Smoking status: Never Smoker  . Smokeless tobacco: Never  Used  Substance and Sexual Activity  . Alcohol use: No  . Drug use: No  . Sexual activity: Yes  Lifestyle  . Physical activity:    Days per week: Not on file    Minutes per session: Not on file  . Stress: Not on file  Relationships  . Social connections:    Talks on phone: Not on file    Gets together: Not on file    Attends religious service: Not on file    Active member of club or organization: Not on file    Attends meetings of clubs or organizations: Not on file    Relationship status: Not on file  Other Topics Concern  . Not on file  Social History Narrative  . Not on file    Allergies:  Allergies  Allergen Reactions  . Ferumoxytol  Shortness Of Breath    Nausea, vomiting, and chest pain Nausea, vomiting, and chest pain   . Latex Hives, Rash, Itching and Other (See Comments)    unknown   . Iron Dextran   . Penicillins Other (See Comments)    Unknown--childhood reaction    Metabolic Disorder Labs: Lab Results  Component Value Date   HGBA1C 5.2 12/19/2017   No results found for: PROLACTIN No results found for: CHOL, TRIG, HDL, CHOLHDL, VLDL, LDLCALC Lab Results  Component Value Date   TSH 2.490 12/19/2017    Therapeutic Level Labs: No results found for: LITHIUM No results found for: VALPROATE No components found for:  CBMZ  Current Medications: Current Outpatient Medications  Medication Sig Dispense Refill  . albuterol (PROAIR HFA) 108 (90 Base) MCG/ACT inhaler Inhale 90 mcg into the lungs.    Marland Kitchen azelastine (ASTELIN) 0.1 % nasal spray Place 2 sprays into both nostrils 2 (two) times daily. 30 mL 5  . busPIRone (BUSPAR) 5 MG tablet Take 1 tablet (5 mg total) by mouth 2 (two) times daily. 60 tablet 1  . cetirizine (ZYRTEC) 10 MG tablet Take 1 tablet (10 mg total) by mouth daily. 30 tablet 11  . Crisaborole (EUCRISA) 2 % OINT Apply 1 application topically 2 (two) times daily. To skin flare 100 g 5  . ibuprofen (ADVIL,MOTRIN) 200 MG tablet Take 200 mg by mouth every 6 (six) hours as needed.    . lamoTRIgine (LAMICTAL) 100 MG tablet Take 1 tablet (100 mg total) by mouth daily. 30 tablet 1  . montelukast (SINGULAIR) 10 MG tablet Take 10 mg by mouth daily.    . norethindrone-ethinyl estradiol (LOESTRIN 1/20, 21,) 1-20 MG-MCG tablet Take 1 tablet by mouth daily. 1 Package 11   No current facility-administered medications for this visit.      Musculoskeletal: Strength & Muscle Tone: within normal limits Gait & Station: normal Patient leans: N/A  Psychiatric Specialty Exam: ROS  Blood pressure 105/73, pulse 64, temperature 98.2 F (36.8 C), height 5\' 2"  (1.575 m), weight 114 lb (51.7 kg), unknown if  currently breastfeeding.There is no height or weight on file to calculate BMI.  General Appearance: Casual  Eye Contact:  Good  Speech:  Clear and Coherent  Volume:  Normal  Mood:  Anxious  Affect:  Appropriate  Thought Process:  Goal Directed  Orientation:  Full (Time, Place, and Person)  Thought Content: Logical   Suicidal Thoughts:  No  Homicidal Thoughts:  No  Memory:  Immediate;   Good Recent;   Good Remote;   Good  Judgement:  Good  Insight:  Good  Psychomotor Activity:  Normal  Concentration:  Concentration: Fair and Attention Span: Fair  Recall:  [employer] of Knowledge: Fair  Language: Good  Akathisia:  No  Handed:  Right  AIMS (if indicated): not done  Assets:  Communication Skills Desire for Improvement Housing Resilience Social Support Talents/Skills Transportation  ADL's:  Intact  Cognition: WNL  Sleep:  Fair   Screenings: GAD-7     Office Visit from 02/08/2018 in [facility]  Total GAD-7 Score  13    PHQ2-9     Office Visit from 09/24/2018 in [facility] Office Visit from 07/02/2018 in [facility] Office Visit from 05/01/2018 in [facility] Office Visit from 03/08/2018 in [facility] Office Visit from 02/08/2018 in [facility]  PHQ-2 Total Score  2  3  3  3  5   PHQ-9 Total Score  8  8  15  13  15        Assessment and Plan: Bipolar disorder type I.  Generalized anxiety disorder.  Patient tolerating Lamictal without any side effects.  Recommended to try Lamictal 150 mg daily and BuSpar 10 mg twice a day.  I also suggest if she cannot sleep then she can consider taking Lunesta 2 mg only as needed for severe insomnia.  Discussed hypnotic side effects, tolerance and abuse.  We will only provide 10 tablets.  She was seeing therapist but due to pandemic coronavirus she has been cut down her therapy visits.  She is hoping to resume therapy once  things get better.  I recommend to call us back if she is any question or any concern.  Follow-up in 2 months.   Cleotis Nipper, MD 01/07/2019, 3:54 PM

## 2019-01-08 ENCOUNTER — Telehealth (HOSPITAL_COMMUNITY): Payer: Self-pay

## 2019-01-08 NOTE — Telephone Encounter (Signed)
Medication management - Met with Dr. Adele Schilder to revirew [facility] Medicaid [place] Prior Authorization request for Sedative Hypnotics Mackenzie Gomez) and signed and then faxed to [place] for requested approval.

## 2019-01-08 NOTE — Telephone Encounter (Signed)
Please call back patient and ask her which nasal spray she has been using.  For nasal congestion a daily steroid based nasal spray works the best such as flonase, nasonex, [employer] forces training and education [employer].  For extreme situations, she can use afrin decongestant nasal spray for up to 5 days and start the steroid nasal spray at the same time. And after 5 days only use the steroid nasal spray.  She can also use an OTC decongestant pill for a few days as long as she has no blood pressure issues.

## 2019-01-08 NOTE — Telephone Encounter (Signed)
LM for patient to return call.

## 2019-01-10 ENCOUNTER — Ambulatory Visit (INDEPENDENT_AMBULATORY_CARE_PROVIDER_SITE_OTHER): Payer: Medicaid Other | Admitting: *Deleted

## 2019-01-10 DIAGNOSIS — J309 Allergic rhinitis, unspecified: Secondary | ICD-10-CM | POA: Diagnosis not present

## 2019-01-10 NOTE — Telephone Encounter (Signed)
Patient came into the injection room today to receive her injection. According to the patient she is only using her Azelastine nasal spray. As previously noted Flonase made the patient have nose bleeds. Patient has tried Afrin without any relief. Patient has not tried any over the counter decongestants and will try some over the weekend and let us know next week with an update.

## 2019-01-16 ENCOUNTER — Ambulatory Visit (INDEPENDENT_AMBULATORY_CARE_PROVIDER_SITE_OTHER): Payer: Medicaid Other

## 2019-01-16 DIAGNOSIS — J309 Allergic rhinitis, unspecified: Secondary | ICD-10-CM

## 2019-01-23 ENCOUNTER — Ambulatory Visit (INDEPENDENT_AMBULATORY_CARE_PROVIDER_SITE_OTHER): Payer: Medicaid Other | Admitting: *Deleted

## 2019-01-23 DIAGNOSIS — J309 Allergic rhinitis, unspecified: Secondary | ICD-10-CM | POA: Diagnosis not present

## 2019-01-27 ENCOUNTER — Ambulatory Visit (INDEPENDENT_AMBULATORY_CARE_PROVIDER_SITE_OTHER): Payer: Medicaid Other | Admitting: Allergy

## 2019-01-27 ENCOUNTER — Other Ambulatory Visit: Payer: Self-pay

## 2019-01-27 ENCOUNTER — Encounter: Payer: Self-pay | Admitting: Allergy

## 2019-01-27 DIAGNOSIS — H1010 Acute atopic conjunctivitis, unspecified eye: Secondary | ICD-10-CM | POA: Diagnosis not present

## 2019-01-27 DIAGNOSIS — J3089 Other allergic rhinitis: Secondary | ICD-10-CM

## 2019-01-27 DIAGNOSIS — J4520 Mild intermittent asthma, uncomplicated: Secondary | ICD-10-CM | POA: Diagnosis not present

## 2019-01-27 DIAGNOSIS — J302 Other seasonal allergic rhinitis: Secondary | ICD-10-CM | POA: Diagnosis not present

## 2019-01-27 DIAGNOSIS — Z880 Allergy status to penicillin: Secondary | ICD-10-CM | POA: Diagnosis not present

## 2019-01-27 DIAGNOSIS — L309 Dermatitis, unspecified: Secondary | ICD-10-CM

## 2019-01-27 MED ORDER — PREDNISONE 10 MG PO TABS: ORAL_TABLET | ORAL | 0 refills | Status: DC

## 2019-01-27 MED ORDER — MOMETASONE FURO-FORMOTEROL FUM 100-5 MCG/ACT IN AERO: 2.0000 | INHALATION_SPRAY | Freq: Two times a day (BID) | RESPIRATORY_TRACT | 3 refills | Status: DC

## 2019-01-27 MED ORDER — FLUTICASONE PROPIONATE 50 MCG/ACT NA SUSP: 1.0000 | Freq: Two times a day (BID) | NASAL | 5 refills | Status: DC

## 2019-01-27 NOTE — Assessment & Plan Note (Signed)
Past history - Rash and pruritus for the past 5 years mainly on her torso.  Usually worse during the summer and fall.  90% resolution of symptoms with prednisone.  Tried over-the-counter antihistamines some benefit and topical steroid creams with no benefit. Interim history - some flares in the scalp.  Continue skin care measures. Use fragrance free and dye free products.   May use Eucrisa as needed twice a day.  May use topical triamcinolone twice a day as needed up to 1-2 weeks at a time below the neck.  Advised patient to send pictures of the scalp rash.

## 2019-01-27 NOTE — Assessment & Plan Note (Addendum)
Past history - Diagnosed with asthma 20 years ago.  Triggered by allergies and infections. Had one course of prednisone this past 12 months. Interim history - was doing well but the last few weeks using albuterol 1-2 times a day.  Daily controller medication(s): Start Dulera 100 2 puffs twice a day for 1 month.  Prior to physical activity:May use albuterol rescue inhaler 2 puffs 5 to 15 minutes prior to strenuous physical activities.  Rescue medications:May use albuterol rescue inhaler 2 puffs or nebulizer every 4 to 6 hours as needed for shortness of breath, chest tightness, coughing, and wheezing. Monitor frequency of use.   During upper respiratory infections: Start Dulera 100 2 puffs twice a day for 1-2 weeks.

## 2019-01-27 NOTE — Assessment & Plan Note (Addendum)
Past history - 2019 skin prick testing was positive to: trees, weeds, grass, ragweed, dust mite, cat, mold and cockroaches. Interim history - started allergy injection with no issues. increased symptoms for the past few weeks with tree pollen despite taking medications on a daily basis.   Start prednisone taper.   Continue allergy shots.  Start Flonase 1 spray twice a day for nasal congestion.   Start azelastine 1-2 sprays twice a day as needed for runny nose.   Nasal saline spray (i.e., Simply Saline) or nasal saline lavage (i.e., NeilMed) is recommended as needed and prior to medicated nasal sprays.  May use over the counter antihistamines such as Zyrtec (cetirizine), Claritin (loratadine), Allegra (fexofenadine), or Xyzal (levocetirizine) daily as needed. May take it twice a day if needed.   Continue Singulair daily.  Continue environments control measures.  May usepataday 1 drop in each eye once a day as needed for itchy/watery eyes.  If this does not help let me know.

## 2019-01-27 NOTE — Progress Notes (Signed)
RE: Mackenzie Gomez MRN: [id number] DOB: [birth year] Date of Telemedicine Visit: 01/27/2019  Referring provider: Kallie Locks, FNP Primary care provider: Kallie Locks, FNP  Chief Complaint: Asthma and Allergic Rhinitis    Telemedicine Follow Up Visit via Telephone: I connected with Mackenzie Gomez for a follow up on 01/27/19 by telephone and verified that I am speaking with the correct person using two identifiers.   I discussed the limitations, risks, security and privacy concerns of performing an evaluation and management service by telephone and the availability of in person appointments. I also discussed with the patient that there may be a patient responsible charge related to this service. The patient expressed understanding and agreed to proceed.  Patient is at home.  Provider is at the office.  Visit start time: 2:22PM Visit end time: 2:43PM Insurance consent/check in by: Doreen Beam Medical consent and medical assistant/nurse: Malcolm Metro  History of Present Illness: She is a 38 y.o. female, who is being followed for allergic rhino conjunctivitis, asthma, h/o penicillin allergy and dermatitis. Her previous allergy office visit was on 10/28/2018 with Dr. Selena Batten. Today is a regular follow up visit.  Seasonal and perennial allergic rhinoconjunctivitis Having issues with throat tightness, itchy eyes, sneezing when going outdoors for the past few weeks.   Currently on Singulair  daily, zyrtec  twice a daily, Afrin prn, azelastine prn, pataday as needed.  Started on allergy injections in January 2020 and doing well. Had 1 large localized reaction.  She does have some increased nasal congestion for 1 day after the injections. Not sure how long she tried nasonex for in the past.   Asthma Using albuterol as needed with the increased pollen with some benefit. Did not have to use Dulera since last OV.  Dermatitis Worse after taking showers. Some benefit with [place].   Going up on the scalp now and it's itchy. Using tea tree oil shampoo and lavender since started with no improvement.   Assessment and Plan: Arelly is a 38 y.o. female with: Mild intermittent asthma without complication Past history - Diagnosed with asthma 20 years ago.  Triggered by allergies and infections. Had one course of prednisone this past 12 months. Interim history - was doing well but the last few weeks using albuterol 1-2 times a day.  Daily controller medication(s): Start Dulera 100 2 puffs twice a day for 1 month.  Prior to physical activity:May use albuterol rescue inhaler 2 puffs 5 to 15 minutes prior to strenuous physical activities.  Rescue medications:May use albuterol rescue inhaler 2 puffs or nebulizer every 4 to 6 hours as needed for shortness of breath, chest tightness, coughing, and wheezing. Monitor frequency of use.   During upper respiratory infections: Start Dulera 100 2 puffs twice a day for 1-2 weeks.  Seasonal and perennial allergic rhinoconjunctivitis Past history - 2019 skin prick testing was positive to: trees, weeds, grass, ragweed, dust mite, cat, mold and cockroaches. Interim history - started allergy injection with no issues. increased symptoms for the past few weeks with tree pollen despite taking medications on a daily basis.   Start prednisone taper.   Continue allergy shots.  Start Flonase 1 spray twice a day for nasal congestion.   Start azelastine 1-2 sprays twice a day as needed for runny nose.   Nasal saline spray (i.e., Simply Saline) or nasal saline lavage (i.e., NeilMed) is recommended as needed and prior to medicated nasal sprays.  May use over the counter antihistamines such as Zyrtec (cetirizine), Claritin (  loratadine), Allegra (fexofenadine), or Xyzal (levocetirizine) daily as needed. May take it twice a day if needed.   Continue Singulair daily.  Continue environments control measures.  May usepataday 1 drop in each eye once a  day as needed for itchy/watery eyes.  If this does not help let me know.   Dermatitis Past history - Rash and pruritus for the past 5 years mainly on her torso.  Usually worse during the summer and fall.  90% resolution of symptoms with prednisone.  Tried over-the-counter antihistamines some benefit and topical steroid creams with no benefit. Interim history - some flares in the scalp.  Continue skin care measures. Use fragrance free and dye free products.   May use Eucrisa as needed twice a day.  May use topical triamcinolone twice a day as needed up to 1-2 weeks at a time below the neck.  Advised patient to send pictures of the scalp rash.   Return in about 3 months (around 04/28/2019).  Meds ordered this encounter  Medications  . fluticasone (FLONASE) 50 MCG/ACT nasal spray    Sig: Place 1 spray into both nostrils 2 (two) times daily.    Dispense:  16 g    Refill:  5  . mometasone-formoterol (DULERA) 100-5 MCG/ACT AERO    Sig: Inhale 2 puffs into the lungs 2 (two) times daily.    Dispense:  1 Inhaler    Refill:  3  . predniSONE (DELTASONE) 10 MG tablet    Sig: Take prednisone 40mg  daily x 2 days, 30mg  daily x 2 days, 20mg  daily x 2 days and 10mg  daily x 2 days.    Dispense:  20 tablet    Refill:  0   Diagnostics: None.  Medication List:  Current Outpatient Medications  Medication Sig Dispense Refill  . albuterol (PROAIR HFA) 108 (90 Base) MCG/ACT inhaler Inhale 90 mcg into the lungs.    . busPIRone (BUSPAR) 10 MG tablet Take 1 tablet (10 mg total) by mouth 2 (two) times daily. 60 tablet 1  . cetirizine (ZYRTEC) 10 MG tablet Take 1 tablet (10 mg total) by mouth daily. 30 tablet 11  . Crisaborole (EUCRISA) 2 % OINT Apply 1 application topically 2 (two) times daily. To skin flare 100 g 5  . eszopiclone (LUNESTA) 2 MG TABS tablet Take 1 tablet (2 mg total) by mouth at bedtime as needed for sleep. Take immediately before bedtime 10 tablet 0  . ibuprofen (ADVIL,MOTRIN) 200 MG  tablet Take 200 mg by mouth every 6 (six) hours as needed.    . lamoTRIgine (LAMICTAL) 150 MG tablet Take 1 tablet (150 mg total) by mouth daily. 30 tablet 1  . montelukast (SINGULAIR) 10 MG tablet Take 10 mg by mouth daily.    . norethindrone-ethinyl estradiol (LOESTRIN 1/20, 21,) 1-20 MG-MCG tablet Take 1 tablet by mouth daily. 1 Package 11  . oxymetazoline (AFRIN 12 HOUR) 0.05 % nasal spray Place 2 sprays into both nostrils 3 (three) times a week.    . Pseudoephedrine HCl (SUDAFED 12 HOUR PO) Take by mouth.    Marland Kitchen azelastine (ASTELIN) 0.1 % nasal spray Place 2 sprays into both nostrils 2 (two) times daily. (Patient not taking: Reported on 01/27/2019) 30 mL 5  . fluticasone (FLONASE) 50 MCG/ACT nasal spray Place 1 spray into both nostrils 2 (two) times daily. 16 g 5  . mometasone-formoterol (DULERA) 100-5 MCG/ACT AERO Inhale 2 puffs into the lungs 2 (two) times daily. 1 Inhaler 3  . predniSONE (DELTASONE) 10 MG  tablet Take prednisone 40mg  daily x 2 days, 30mg  daily x 2 days, 20mg  daily x 2 days and 10mg  daily x 2 days. 20 tablet 0   No current facility-administered medications for this visit.    Allergies: Allergies  Allergen Reactions  . Ferumoxytol Shortness Of Breath    Nausea, vomiting, and chest pain Nausea, vomiting, and chest pain   . Latex Hives, Rash, Itching and Other (See Comments)    unknown   . Iron Dextran   . Penicillins Other (See Comments)    Unknown--childhood reaction   I reviewed her past medical history, social history, family history, and environmental history and no significant changes have been reported from previous visit on 10/28/2018.  Review of Systems  Constitutional: Negative for appetite change, chills, fever and unexpected weight change.  HENT: Positive for congestion, rhinorrhea and sneezing.   Eyes: Positive for itching.  Respiratory: Negative for cough, chest tightness, shortness of breath and wheezing.   Cardiovascular: Negative for chest pain.   Gastrointestinal: Negative for abdominal pain.  Genitourinary: Negative for difficulty urinating.  Skin: Positive for rash.  Allergic/Immunologic: Positive for environmental allergies. Negative for food allergies.  Neurological: Positive for headaches.   Objective: Physical Exam  No distress during conversation. Speaking in full sentences.  Not obtained as encounter was done via telephone.   Previous notes and tests were reviewed.  I discussed the assessment and treatment plan with the patient. The patient was provided an opportunity to ask questions and all were answered. The patient agreed with the plan and demonstrated an understanding of the instructions. After visit summary/patient instructions available via mychart.   The patient was advised to call back or seek an in-person evaluation if the symptoms worsen or if the condition fails to improve as anticipated.  I provided 21 minutes of non-face-to-face time during this encounter.  It was my pleasure to participate in Mackenzie Gomez's care today. Please feel free to contact me with any questions or concerns.   Sincerely,  Wyline MoodYoon Khamron Gellert, DO Allergy & Immunology  [facility] [place] office: [phone] [facility] office: [phone]

## 2019-01-27 NOTE — Patient Instructions (Addendum)
Seasonal and perennial allergic rhinoconjunctivitis Past history - 2019 skin prick testing was positive to: trees, weeds, grass, ragweed, dust mite, cat, mold and cockroaches.  Start prednisone taper.   Continue allergy shots.  Start Flonase 1 spray twice a day for nasal stuffiness. If it causes bloody nose let me know.    Please review proper usage - https://www.flonase.com/allergies/how-to-use-nasal-spray/  Start azelastine 1-2 sprays twice a day as needed for runny nose.   Nasal saline spray (i.e., Simply Saline) or nasal saline lavage (i.e., NeilMed) is recommended as needed and prior to medicated nasal sprays.  May use over the counter antihistamines such as Zyrtec (cetirizine), Claritin (loratadine), Allegra (fexofenadine), or Xyzal (levocetirizine) daily as needed. May take it twice a day if needed.   Continue Singulair daily.  Continue environments control measures.  May usepataday 1 drop in each eye once a day as needed for itchy/watery eyes.  If this does not help let me know.   Mild intermittent asthma  Daily controller medication(s): Start Dulera 100 2 puffs twice a day for 1 month.  Prior to physical activity:May use albuterol rescue inhaler 2 puffs 5 to 15 minutes prior to strenuous physical activities.  Rescue medications:May use albuterol rescue inhaler 2 puffs or nebulizer every 4 to 6 hours as needed for shortness of breath, chest tightness, coughing, and wheezing. Monitor frequency of use.   During upper respiratory infections: Start Dulera 100 2 puffs twice a day for 1-2 weeks. . Asthma control goals:  o Full participation in all desired activities (may need albuterol before activity) o Albuterol use two times or less a week on average (not counting use with activity) o Cough interfering with sleep two times or less a month o Oral steroids no more than once a year o No hospitalizations  Dermatitis  Continue skin care measures. Use fragrance free  and dye free products.   May use Eucrisa as needed twice a day.  May use topical triamcinolone twice a day as needed up to 1-2 weeks at a time below the neck.  Send me pictures of your scalp.  Follow up in 3 months  Reducing Pollen Exposure . Pollen seasons: trees (spring), grass (summer) and ragweed/weeds (fall). Marland Kitchen Keep windows closed in your home and car to lower pollen exposure.  Mackenzie Gomez air conditioning in the bedroom and throughout the house if possible.  . Avoid going out in dry windy days - especially early morning. . Pollen counts are highest between 5 - 10 AM and on dry, hot and windy days.  . Save outside activities for late afternoon or after a heavy rain, when pollen levels are lower.  . Avoid mowing of grass if you have grass pollen allergy. Marland Kitchen Be aware that pollen can also be transported indoors on people and pets.  . Dry your clothes in an automatic dryer rather than hanging them outside where they might collect pollen.  . Rinse hair and eyes before bedtime.  Skin care recommendations  Bath time: . Always use lukewarm water. AVOID very hot or cold water. Marland Kitchen Keep bathing time to 5-10 minutes. . Do NOT use bubble bath. . Use a mild soap and use just enough to wash the dirty areas. . Do NOT scrub skin vigorously.  . After bathing, pat dry your skin with a towel. Do NOT rub or scrub the skin.  Moisturizers and prescriptions:  . ALWAYS apply moisturizers immediately after bathing (within 3 minutes). This helps to lock-in moisture. . Use the moisturizer  several times a day over the whole body. Peri Jefferson. Good summer moisturizers include: Aveeno, CeraVe, Cetaphil. Peri Jefferson. Good winter moisturizers include: Aquaphor, Vaseline, Cerave, Cetaphil, Eucerin, Vanicream. . When using moisturizers along with medications, the moisturizer should be applied about one hour after applying the medication to prevent diluting effect of the medication or moisturize around where you applied the  medications. When not using medications, the moisturizer can be continued twice daily as maintenance.  Laundry and clothing: . Avoid laundry products with added color or perfumes. . Use unscented hypo-allergenic laundry products such as Tide free, Cheer free & gentle, and All free and clear.  . If the skin still seems dry or sensitive, you can try double-rinsing the clothes. . Avoid tight or scratchy clothing such as wool. . Do not use fabric softeners or dyer sheets.

## 2019-01-27 NOTE — Progress Notes (Signed)
Start time:  47 Finish Time:  2:43PM Where are you located:  home Do you give [place] permission to bill your insurance:  yes Are you signed up for my chart:  yes  Pt states that she has stopped using the azelastine nasal spray.  Has not been able to walk outside because of the pollen.  Her nose is stuffed up and nothing is able to go up her nose.  Using saline lavage and nothing is working.  Has been using afrin and sudafed.  Has been having these issues after getting her allergy injections. Happens at night when she is laying down.

## 2019-02-03 ENCOUNTER — Ambulatory Visit (INDEPENDENT_AMBULATORY_CARE_PROVIDER_SITE_OTHER): Payer: Medicaid Other

## 2019-02-03 DIAGNOSIS — J309 Allergic rhinitis, unspecified: Secondary | ICD-10-CM | POA: Diagnosis not present

## 2019-02-05 ENCOUNTER — Telehealth (HOSPITAL_COMMUNITY): Payer: Self-pay

## 2019-02-05 DIAGNOSIS — F5101 Primary insomnia: Secondary | ICD-10-CM

## 2019-02-05 MED ORDER — ESZOPICLONE 3 MG PO TABS: 3.0000 mg | ORAL_TABLET | Freq: Every evening | ORAL | 0 refills | Status: DC | PRN

## 2019-02-05 NOTE — Telephone Encounter (Signed)
Patient called and stated her Mackenzie Gomez is semi working. She does not have an appointment coming up until 5/28. Please review and advise. Thank You

## 2019-02-05 NOTE — Telephone Encounter (Signed)
She can try Lunesta 3 mg however should not take it every night.  We will discuss more options on her next appointment if she continues to struggle with insomnia. I will call new dose prescription to her pharmacy.

## 2019-02-05 NOTE — Telephone Encounter (Signed)
Called patient back to let her know prescription for her Alfonso Patten is at pharmacy. She stated that medication needed a PA so looking out for that.

## 2019-02-12 ENCOUNTER — Ambulatory Visit (INDEPENDENT_AMBULATORY_CARE_PROVIDER_SITE_OTHER): Payer: Medicaid Other

## 2019-02-12 DIAGNOSIS — J309 Allergic rhinitis, unspecified: Secondary | ICD-10-CM | POA: Diagnosis not present

## 2019-02-21 ENCOUNTER — Ambulatory Visit (INDEPENDENT_AMBULATORY_CARE_PROVIDER_SITE_OTHER): Payer: Medicaid Other | Admitting: *Deleted

## 2019-02-21 DIAGNOSIS — J309 Allergic rhinitis, unspecified: Secondary | ICD-10-CM | POA: Diagnosis not present

## 2019-02-27 ENCOUNTER — Ambulatory Visit (INDEPENDENT_AMBULATORY_CARE_PROVIDER_SITE_OTHER): Payer: Medicaid Other

## 2019-02-27 DIAGNOSIS — J309 Allergic rhinitis, unspecified: Secondary | ICD-10-CM | POA: Diagnosis not present

## 2019-03-03 ENCOUNTER — Telehealth: Payer: Medicaid Other | Admitting: Physician Assistant

## 2019-03-03 DIAGNOSIS — R42 Dizziness and giddiness: Secondary | ICD-10-CM

## 2019-03-03 MED ORDER — MECLIZINE HCL 25 MG PO TABS: 25.0000 mg | ORAL_TABLET | Freq: Three times a day (TID) | ORAL | 0 refills | Status: DC | PRN

## 2019-03-03 NOTE — Progress Notes (Signed)
E Visit for Motion Sickness  We are sorry that you are not feeling well. Here is how we plan to help!  Based on what you have shared with me it looks like you have symptoms of motion sickness.  I have prescribed a medication that will help prevent or alleviate your symptoms:  Meclizine 25mg by mouth three times per day as needed for nausea/motion sickness   Prevention:  You might feel better if you keep your eyes focused on outside while you are in motion. For example, if you are in a car, sit in the front and look in the direction you are moving; if you are on a boat, stay on the deck and look to the horizon. This helps make what you see match the movement you are feeling, and so you are less likely to feel sick.  You should also avoid reading, watching a movie, texting or reading messages, or looking at things close to you inside the vehicle you are riding in.  . Use the seat head rest. Lean your head against the back of the seat or head rest when traveling in vehicles with seats to minimize head movements.  . On a ship: When making your reservations, choose a cabin in the middle of the ship and near the waterline. When on board, go up on deck and focus on the horizon.  . In an airplane: Request a window seat and look out the window. A seat over the front edge of the wing is the most preferable spot (the degree of motion is the lowest here). Direct the air vent to blow cool air on your face.  . On a train: Always face forward and sit near a window.  . In a vehicle: Sit in the front seat; if you are the passenger, look at the scenery in the distance. For some people, driving the vehicle (rather than being a passenger) is an instant remedy.  . Avoid others who have become nauseous with motion sickness. Seeing and smelling others who have motion sickness may cause you to become sick.  GET HELP RIGHT AWAY IF:   Your symptoms do not improve or worsen within 2 days after  treatment.   You cannot keep down fluids after trying the medication.   Other associated symptoms such as severe headache, visual field changes, fever, or intractable nausea and vomiting.  MAKE SURE YOU:   Understand these instructions.  Will watch your condition.  Will get help right away if you are not doing well or get worse.  Thank you for choosing an e-visit.  Your e-visit answers were reviewed by a board certified advanced clinical practitioner to complete your personal care plan. Depending upon the condition, your plan could have included both over the counter or prescription medications.  Please review your pharmacy choice. Be sure that the pharmacy you have chosen is open so that you can pick up your prescription now.  If there is a problem you may message your provider in MyChart to have the prescription routed to another pharmacy.  Your safety is important to us. If you have drug allergies check your prescription carefully.   For the next 24 hours, you can use MyChart to ask questions about today's visit, request a non-urgent call back, or ask for a work or school excuse from your e-visit provider.  You will get an e-mail in the next two days asking about your experience. I hope that your e-visit has been valuable and will speed   your recovery.   References or for more information: https://wwwnc.cdc.gov/travel/yellowbook/2020/travel-by-air-land-sea/motion-sickness https://my.[facility].org/health/articles/12782-motion-sickness https://www.uptodate.com I have spent 7 min in completion and review of this note- Christerpher Clos PAC  

## 2019-03-04 IMAGING — CR DG ABDOMEN 1V
1 series · 1 of 1 positions shown · non-contrast
Comparison: None.

CLINICAL DATA: RIGHT flank pain tonight, nausea, vomiting and
diarrhea. History kidney stones.

EXAM:
ABDOMEN - 1 VIEW

[abdomen kub]
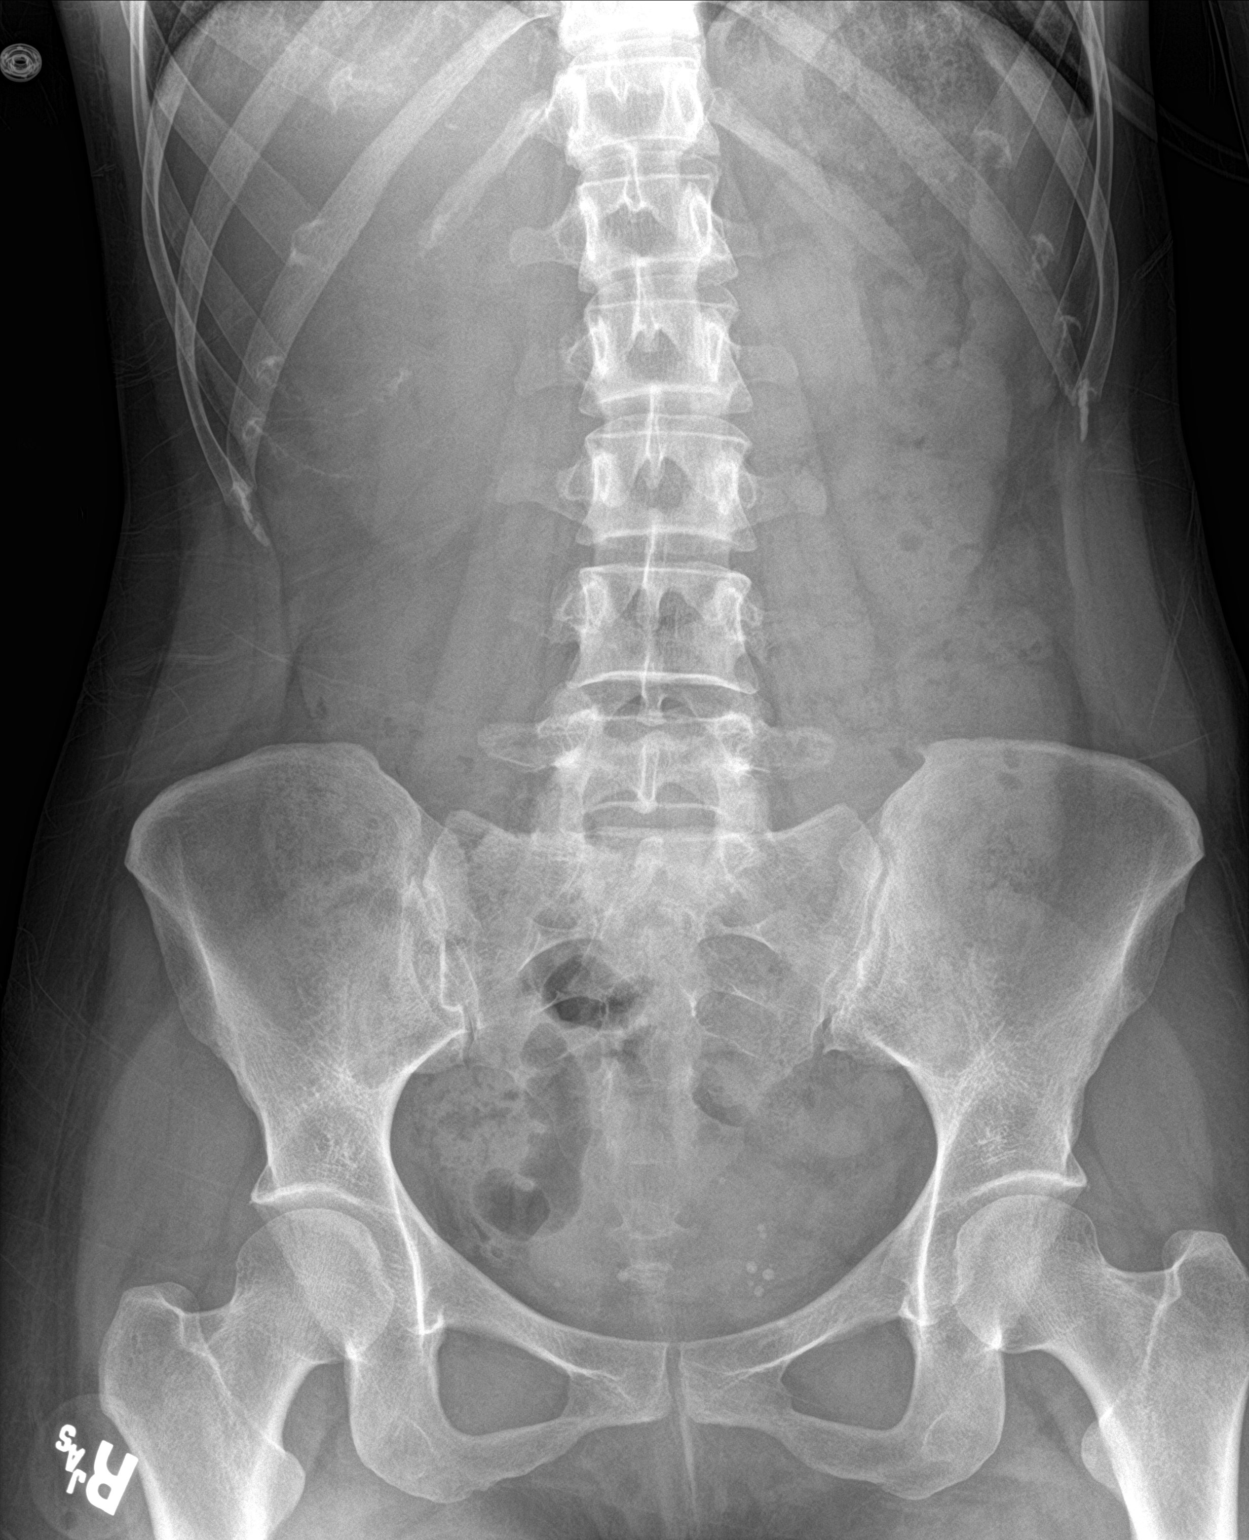

[1 of 1 positions shown; findings below may reference images not displayed]

FINDINGS: Paucity of bowel gas. No radio-opaque calculi or other significant
radiographic abnormality are seen. Faint calcifications projecting
RIGHT upper quadrant most consistent with anterior rib. Phleboliths
in the pelvis.
IMPRESSION: Nonspecific bowel gas pattern. No radiographic findings of
nephrolithiasis though CT is more sensitive.

## 2019-03-06 ENCOUNTER — Telehealth (HOSPITAL_COMMUNITY): Payer: Self-pay

## 2019-03-06 DIAGNOSIS — F3130 Bipolar disorder, current episode depressed, mild or moderate severity, unspecified: Secondary | ICD-10-CM

## 2019-03-06 MED ORDER — LAMOTRIGINE 150 MG PO TABS: 150.0000 mg | ORAL_TABLET | Freq: Every day | ORAL | 0 refills | Status: DC

## 2019-03-06 NOTE — Telephone Encounter (Signed)
done

## 2019-03-06 NOTE — Telephone Encounter (Signed)
Medication refill - Fax received from pt's Mackenzie Gomez pharmay for a refill of her prescribed Lamictal. last ordered 01/07/19 + 1 refill. Pt. last filled it 02/05/19 and does not return until 03/13/19.  Patient set to run out prior to next appt.

## 2019-03-07 ENCOUNTER — Ambulatory Visit (INDEPENDENT_AMBULATORY_CARE_PROVIDER_SITE_OTHER): Payer: Medicaid Other | Admitting: *Deleted

## 2019-03-07 DIAGNOSIS — J309 Allergic rhinitis, unspecified: Secondary | ICD-10-CM | POA: Diagnosis not present

## 2019-03-13 ENCOUNTER — Encounter (HOSPITAL_COMMUNITY): Payer: Self-pay | Admitting: Psychiatry

## 2019-03-13 ENCOUNTER — Other Ambulatory Visit: Payer: Self-pay

## 2019-03-13 ENCOUNTER — Ambulatory Visit (INDEPENDENT_AMBULATORY_CARE_PROVIDER_SITE_OTHER): Payer: Medicaid Other | Admitting: Psychiatry

## 2019-03-13 DIAGNOSIS — F3130 Bipolar disorder, current episode depressed, mild or moderate severity, unspecified: Secondary | ICD-10-CM | POA: Diagnosis not present

## 2019-03-13 DIAGNOSIS — F5101 Primary insomnia: Secondary | ICD-10-CM | POA: Diagnosis not present

## 2019-03-13 DIAGNOSIS — F411 Generalized anxiety disorder: Secondary | ICD-10-CM | POA: Diagnosis not present

## 2019-03-13 MED ORDER — GABAPENTIN 100 MG PO CAPS: 100.0000 mg | ORAL_CAPSULE | Freq: Three times a day (TID) | ORAL | 1 refills | Status: DC

## 2019-03-13 MED ORDER — ESZOPICLONE 2 MG PO TABS: 2.0000 mg | ORAL_TABLET | Freq: Every evening | ORAL | 1 refills | Status: DC | PRN

## 2019-03-13 MED ORDER — LAMOTRIGINE 150 MG PO TABS: 150.0000 mg | ORAL_TABLET | Freq: Every day | ORAL | 1 refills | Status: DC

## 2019-03-13 NOTE — Progress Notes (Addendum)
Virtual Visit via Telephone Note  I connected with Mackenzie Gomez on 03/13/19 at  3:40 PM EDT by telephone and verified that I am speaking with the correct person using two identifiers.   I discussed the limitations, risks, security and privacy concerns of performing an evaluation and management service by telephone and the availability of in person appointments. I also discussed with the patient that there may be a patient responsible charge related to this service. The patient expressed understanding and agreed to proceed.   History of Present Illness: Patient was evaluated by phone session.  She still struggle with insomnia and anxiety.  We have tried Lunesta initially 2 mg and then 3 mg but she reported 3 mg giving her a lot of headaches and hangover.  She feels the 2 mg is not strong enough.  She feels her major reason for insomnia is anxiety  We increase Lamictal on her las visit and she noticed her mood swing irritability and highs and lows is much stable.  She denies any mania or any psychosis.  She reported no tremors shakes or any EPS she also reported no rash with the Lamictal.  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  Her biggest stressor is home schooling.  Her son and daughter are in kindergarten in second grade.  Patient told that she started seeing her boyfriend who she broke up few months ago.  Patient told though she did not established a formal relationship and like to focus on her mental health. She is not sure about the future outcome of relationship.  Patient denies drinking or using any illegal substances.  She also mention BuSpar does not help her anxiety and she is open to try a different medication.  Her appetite is okay.  Her energy level is good.   Past Psychiatric History: Reviewed. Depression since high school. Tried Ritalin to help her focus but stopped after 1 year. Seen therapist on and off for cutting herself. Tried Prozac, Vistaril, Lexapro,  Seroquel and trazodone but stopped as it causes excessive sedation. No history of suicidal attempt or psychiatric inpatient treatment. History of mood swing, depression, anxiety and poor pulse control with excessive spending.Was taking Lexapro but when switched to Lamictal she stopped.   Psychiatric Specialty Exam: Physical Exam  ROS  unknown if currently breastfeeding.There is no height or weight on file to calculate BMI.  General Appearance: NA  Eye Contact:  NA  Speech:  Clear and Coherent  Volume:  Normal  Mood:  Anxious  Affect:  NA  Thought Process:  Goal Directed  Orientation:  Full (Time, Place, and Person)  Thought Content:  Logical  Suicidal Thoughts:  No  Homicidal Thoughts:  No  Memory:  Immediate;   Good Recent;   Good Remote;   Good  Judgement:  Good  Insight:  Good  Psychomotor Activity:  NA  Concentration:  Concentration: Good and Attention Span: Good  Recall:  Good  Fund of Knowledge:  Good  Language:  Good  Akathisia:  No  Handed:  Right  AIMS (if indicated):     Assets:  Communication Skills Desire for Improvement Housing Resilience Social Support  ADL's:  Intact  Cognition:  WNL  Sleep:   fair      Assessment and Plan: Primary insomnia, bipolar disorder type I.  Anxiety disorder.  I review her medication and psychosocial stressors.  She is feeling better since Lamictal dose increase but continues to struggle with anxiety.  She  does not feel BuSpar helping her.  I recommend to try gabapentin 100 mg up to 3 times a day and if it make her sleepy then she should take at bedtime.  She still like to have Lunesta but lower dose as in case she could not sleep with gabapentin.  We will provide 15 tablets of Lunesta 2 mg.  Discussed medication side effects and benefits specially gabapentin can cause dizziness.  Continue Lamictal 150 mg daily.  She was seeing therapist due to COVID-19 unable to continue therapy on a regular basis.  I recommend to call us  back if she is any question or any concern.  Follow-up in 6 weeks.  Follow Up Instructions:    I discussed the assessment and treatment plan with the patient. The patient was provided an opportunity to ask questions and all were answered. The patient agreed with the plan and demonstrated an understanding of the instructions.   The patient was advised to call back or seek an in-person evaluation if the symptoms worsen or if the condition fails to improve as anticipated.  I provided 20 minutes of non-face-to-face time during this encounter.   Cleotis Nipper, MD

## 2019-03-17 ENCOUNTER — Ambulatory Visit (INDEPENDENT_AMBULATORY_CARE_PROVIDER_SITE_OTHER): Payer: Medicaid Other

## 2019-03-17 DIAGNOSIS — J309 Allergic rhinitis, unspecified: Secondary | ICD-10-CM

## 2019-03-26 ENCOUNTER — Ambulatory Visit: Payer: No Typology Code available for payment source | Admitting: Family Medicine

## 2019-03-27 ENCOUNTER — Ambulatory Visit (INDEPENDENT_AMBULATORY_CARE_PROVIDER_SITE_OTHER): Payer: Medicaid Other | Admitting: *Deleted

## 2019-03-27 DIAGNOSIS — J309 Allergic rhinitis, unspecified: Secondary | ICD-10-CM

## 2019-03-28 ENCOUNTER — Ambulatory Visit (INDEPENDENT_AMBULATORY_CARE_PROVIDER_SITE_OTHER): Payer: Medicaid Other | Admitting: Family Medicine

## 2019-03-28 ENCOUNTER — Other Ambulatory Visit: Payer: Self-pay

## 2019-03-28 DIAGNOSIS — Z9889 Other specified postprocedural states: Secondary | ICD-10-CM

## 2019-03-28 DIAGNOSIS — N946 Dysmenorrhea, unspecified: Secondary | ICD-10-CM | POA: Diagnosis not present

## 2019-03-28 DIAGNOSIS — N921 Excessive and frequent menstruation with irregular cycle: Secondary | ICD-10-CM

## 2019-03-28 DIAGNOSIS — J302 Other seasonal allergic rhinitis: Secondary | ICD-10-CM | POA: Diagnosis not present

## 2019-03-28 DIAGNOSIS — Z09 Encounter for follow-up examination after completed treatment for conditions other than malignant neoplasm: Secondary | ICD-10-CM | POA: Diagnosis not present

## 2019-03-28 NOTE — Progress Notes (Signed)
Virtual Visit via Telephone Note  I connected with Mackenzie Gomez on 04/01/19 at  1:40 PM EDT by telephone and verified that I am speaking with the correct person using two identifiers.   I discussed the limitations, risks, security and privacy concerns of performing an evaluation and management service by telephone and the availability of in person appointments. I also discussed with the patient that there may be a patient responsible charge related to this service. The patient expressed understanding and agreed to proceed.   History of Present Illness:  Past Medical History:  Diagnosis Date  . Kidney stone   . Mental disorder   . Mild intermittent asthma 07/26/2018  . Recurrent upper respiratory infection (URI)   . Urinary tract infection     Current Outpatient Medications on File Prior to Visit  Medication Sig Dispense Refill  . albuterol (PROAIR HFA) 108 (90 Base) MCG/ACT inhaler Inhale 90 mcg into the lungs.    . cetirizine (ZYRTEC) 10 MG tablet Take 1 tablet (10 mg total) by mouth daily. 30 tablet 11  . Crisaborole (EUCRISA) 2 % OINT Apply 1 application topically 2 (two) times daily. To skin flare 100 g 5  . eszopiclone (LUNESTA) 2 MG TABS tablet Take 1 tablet (2 mg total) by mouth at bedtime as needed for sleep. Take immediately before bedtime 15 tablet 1  . fluticasone (FLONASE) 50 MCG/ACT nasal spray Place 1 spray into both nostrils 2 (two) times daily. 16 g 5  . gabapentin (NEURONTIN) 100 MG capsule Take 1 capsule (100 mg total) by mouth 3 (three) times daily. 90 capsule 1  . ibuprofen (ADVIL,MOTRIN) 200 MG tablet Take 200 mg by mouth every 6 (six) hours as needed.    . lamoTRIgine (LAMICTAL) 150 MG tablet Take 1 tablet (150 mg total) by mouth daily. 30 tablet 1  . meclizine (ANTIVERT) 25 MG tablet Take 1 tablet (25 mg total) by mouth 3 (three) times daily as needed for dizziness. 18 tablet 0  . mometasone-formoterol (DULERA) 100-5 MCG/ACT AERO Inhale 2 puffs into the lungs 2 (two)  times daily. 1 Inhaler 3  . montelukast (SINGULAIR) 10 MG tablet Take 10 mg by mouth daily.    Marland Kitchen azelastine (ASTELIN) 0.1 % nasal spray Place 2 sprays into both nostrils 2 (two) times daily. (Patient not taking: Reported on 01/27/2019) 30 mL 5   No current facility-administered medications on file prior to visit.     Current Status: Since her last office visit, she is doing well with no complaints. She has been experiencing increased Vertigo, and was prescribed motion sickness medication, which is no effective. She has a recent appointment with Psychiatrist and continues to follow up. She has also followed up with Allergist.  She denies fevers, chills, fatigue, recent infections, weight loss, and night sweats. She has not had any headaches, visual changes, dizziness, and falls. No chest pain, heart palpitations, cough and shortness of breath reported. No reports of GI problems such as nausea, vomiting, diarrhea, and constipation. She has no reports of blood in stools, dysuria and hematuria. Her anxiety is mild today. She denies suicidal ideations, homicidal ideations, or auditory hallucinations. She denies pain today.   Observations/Objective:  Telephone Virtual Visit   Assessment and Plan:  1. Dysmenorrhea - Ambulatory referral to Obstetrics / Gynecology  2. Menorrhagia with irregular cycle - Ambulatory referral to Obstetrics / Gynecology  3. History of prior ablation treatment - Ambulatory referral to Obstetrics / Gynecology  4. Seasonal allergies Stable.   No  orders of the defined types were placed in this encounter.  No orders of the defined types were placed in this encounter.   Referral Orders     Ambulatory referral to Obstetrics / Gynecology   Raliegh IpNatalie Jolleen Seman,  MSN, FNP-BC [facility] [address]  [place], [place] [id number] [phone]   Follow Up Instructions:  She will follow up in 6 months.    I discussed the  assessment and treatment plan with the patient. The patient was provided an opportunity to ask questions and all were answered. The patient agreed with the plan and demonstrated an understanding of the instructions.   The patient was advised to call back or seek an in-person evaluation if the symptoms worsen or if the condition fails to improve as anticipated.  I provided 20 minutes of non-face-to-face time during this encounter.   Kallie LocksNatalie M Hykeem Ojeda, FNP

## 2019-04-01 ENCOUNTER — Telehealth: Payer: Self-pay

## 2019-04-01 DIAGNOSIS — N921 Excessive and frequent menstruation with irregular cycle: Secondary | ICD-10-CM | POA: Insufficient documentation

## 2019-04-01 DIAGNOSIS — J302 Other seasonal allergic rhinitis: Secondary | ICD-10-CM | POA: Insufficient documentation

## 2019-04-01 DIAGNOSIS — N946 Dysmenorrhea, unspecified: Secondary | ICD-10-CM | POA: Insufficient documentation

## 2019-04-01 NOTE — Telephone Encounter (Signed)
Called the patient to confirm the upcoming appointment. Left a detailed voicemail of the appointment date and time. Also informed of the office number if she has any questions or concerns. °

## 2019-04-02 ENCOUNTER — Encounter: Payer: Self-pay | Admitting: *Deleted

## 2019-04-08 ENCOUNTER — Telehealth: Payer: Medicaid Other | Admitting: Physician Assistant

## 2019-04-08 ENCOUNTER — Ambulatory Visit (INDEPENDENT_AMBULATORY_CARE_PROVIDER_SITE_OTHER): Payer: Medicaid Other | Admitting: *Deleted

## 2019-04-08 DIAGNOSIS — H9201 Otalgia, right ear: Secondary | ICD-10-CM

## 2019-04-08 DIAGNOSIS — J309 Allergic rhinitis, unspecified: Secondary | ICD-10-CM

## 2019-04-08 MED ORDER — CIPRODEX 0.3-0.1 % OT SUSP: 2.0000 [drp] | Freq: Four times a day (QID) | OTIC | 0 refills | Status: DC

## 2019-04-08 NOTE — Progress Notes (Signed)
E Visit for Swimmer's Ear  We are sorry that you are not feeling well. Here is how we plan to help!  I have prescribed: Ciprofloxin 0.2% and hydrocortisone 1% otic suspension 3 drops in affected ears twice daily for 7 days    In certain cases swimmer's ear may progress to a more serious bacterial infection of the middle or inner ear.  If you have a fever 102 and up and significantly worsening symptoms, this could indicate a more serious infection moving to the middle/inner and needs face to face evaluation in an office by a provider.  Your symptoms should improve over the next 3 days and should resolve in about 7 days.  HOME CARE:  Wash your hands frequently. Do not place the tip of the bottle on your ear or touch it with your fingers. You can take Acetominophen 650 mg every 4-6 hours as needed for pain.  If pain is severe or moderate, you can apply a heating pad (set on low) or hot water bottle (wrapped in a towel) to outer ear for 20 minutes.  This will also increase drainage. Avoid ear plugs Do not use Q-tips After showers, help the water run out by tilting your head to one side.  GET HELP RIGHT AWAY IF:  Fever is over 102.2 degrees. You develop progressive ear pain or hearing loss. Ear symptoms persist longer than 3 days after treatment.  MAKE SURE YOU:  Understand these instructions. Will watch your condition. Will get help right away if you are not doing well or get worse.  TO PREVENT SWIMMER'S EAR: Use a bathing cap or custom fitted swim molds to keep your ears dry. Towel off after swimming to dry your ears. Tilt your head or pull your earlobes to allow the water to escape your ear canal. If there is still water in your ears, consider using a hairdryer on the lowest setting.  Thank you for choosing an e-visit. Your e-visit answers were reviewed by a board certified advanced clinical practitioner to complete your personal care plan. Depending upon the condition, your  plan could have included both over the counter or prescription medications. Please review your pharmacy choice. Be sure that the pharmacy you have chosen is open so that you can pick up your prescription now.  If there is a problem you may message your provider in [place] to have the prescription routed to another pharmacy. Your safety is important to [place]. If you have drug allergies check your prescription carefully.  For the next 24 hours, you can use MyChart to ask questions about today's visit, request a non-urgent call back, or ask for a work or school excuse from your e-visit provider. You will get an email in the next two days asking about your experience. I hope that your e-visit has been valuable and will speed your recovery.       ===View-only below this line===   ----- Message -----    From: Kiari Martinique    Sent: 04/08/2019  8:10 AM EDT      To: E-Visit Mailing List Subject: Swimmer's Ear  Swimmer's Ear --------------------------------  Question: How long have you experienced these symptoms? Answer:   Beginning today  Question: Which ear is causing you discomfort? Answer:   Right  Question: Have you experienced drainage from your ear? Answer:   No  Question: Do you experience pain when touching or wiggling your earlobe? Answer:   Yes  Question: If yes you have pain, please rate pain  on a scale of 1-10 (range: 1 - 10) Answer:   10  Question: Are your glands swollen in your neck? Answer:   Yes  Question: Does your ear feel plugged or any hearing loss? (Check all that apply) Answer:   I have some hearing loss  Question: Do you have a fever? Answer:   No  Question: Any pain when you blow your nose? Answer:   Yes  Question: Have you tried any over the counter ear drops? Answer:   Yes  Question: If yes, you have tried over the counter ear drops please list below: Answer:     Question: Have the over the counter medications helped your symptoms? Answer:    No  Question: Please list your medication allergies that you may have ? (If 'none' , please list as 'none') Answer:   Penicillin  Question: Please list any additional comments  Answer:   Severe ear pain that radiates down my neck. Like an ice pick is being jammed in my ear.  Question: Are you pregnant? Answer:   I am confident that I am not pregnant  Question: Are you breastfeeding? Answer:   No  A total of 5-10 minutes was spent evaluating this patients questionnaire and formulating a plan of care.

## 2019-04-16 ENCOUNTER — Ambulatory Visit (INDEPENDENT_AMBULATORY_CARE_PROVIDER_SITE_OTHER): Payer: Medicaid Other | Admitting: *Deleted

## 2019-04-16 DIAGNOSIS — J309 Allergic rhinitis, unspecified: Secondary | ICD-10-CM

## 2019-04-24 ENCOUNTER — Ambulatory Visit (INDEPENDENT_AMBULATORY_CARE_PROVIDER_SITE_OTHER): Payer: Medicaid Other | Admitting: *Deleted

## 2019-04-24 DIAGNOSIS — J309 Allergic rhinitis, unspecified: Secondary | ICD-10-CM

## 2019-04-28 ENCOUNTER — Ambulatory Visit: Payer: Medicaid Other | Admitting: Allergy

## 2019-04-28 NOTE — Progress Notes (Deleted)
Follow Up Note  RE: Mackenzie Gomez MRN: [id number] DOB: 1980/11/10 Date of Office Visit: 04/28/2019  Referring provider: Azzie Glatter, FNP Primary care provider: Azzie Glatter, FNP  Chief Complaint: No chief complaint on file.  History of Present Illness: I had the pleasure of seeing Mackenzie Gomez for a follow up visit at the [facility] on 04/28/2019. She is a 38 y.o. female, who is being followed for asthma, allergic rhino conjunctivitis, dermatitis. Today she is here for regular follow up visit. Her previous allergy office visit was on 01/27/2019 with Dr. Maudie Mercury.   Mild intermittent asthma without complication Past history - Diagnosed with asthma 20 years ago.  Triggered by allergies and infections. Had one course of prednisone this past 12 months. Interim history - was doing well but the last few weeks using albuterol 1-2 times a day.  Daily controller medication(s): Start Dulera 100 2 puffs twice a day for 1 month.  Prior to physical activity:May use albuterol rescue inhaler 2 puffs 5 to 15 minutes prior to strenuous physical activities.  Rescue medications:May use albuterol rescue inhaler 2 puffs or nebulizer every 4 to 6 hours as needed for shortness of breath, chest tightness, coughing, and wheezing. Monitor frequency of use.   During upper respiratory infections: Start Dulera 100 2 puffs twice a day for 1-2 weeks.  Seasonal and perennial allergic rhinoconjunctivitis Past history - 2019 skin prick testing was positive to: trees, weeds, grass, ragweed, dust mite, cat, mold and cockroaches. Interim history - started allergy injection with no issues. increased symptoms for the past few weeks with tree pollen despite taking medications on a daily basis.   Start prednisone taper.   Continue allergy shots.  Start Flonase 1 spray twice a day for nasal congestion.   Start azelastine 1-2 sprays twice a day as needed for runny nose.   Nasal saline spray (i.e.,  Simply Saline) or nasal saline lavage (i.e., NeilMed) is recommended as needed and prior to medicated nasal sprays.  May use over the counter antihistamines such as Zyrtec (cetirizine), Claritin (loratadine), Allegra (fexofenadine), or Xyzal (levocetirizine) daily as needed. May take it twice a day if needed.   Continue Singulair daily.  Continue environments control measures.  May usepataday 1 drop in each eye once a day as needed for itchy/watery eyes.  If this does not help let me know.   Dermatitis Past history - Rash and pruritus for the past 5 years mainly on her torso.  Usually worse during the summer and fall.  90% resolution of symptoms with prednisone.  Tried over-the-counter antihistamines some benefit and topical steroid creams with no benefit. Interim history - some flares in the scalp.  Continue skin care measures. Use fragrance free and dye free products.   May use Eucrisa as needed twice a day.  May use topical triamcinolone twice a day as needed up to 1-2 weeks at a time below the neck.  Advised patient to send pictures of the scalp rash.   Return in about 3 months (around 04/28/2019).  Assessment and Plan: Mackenzie Gomez is a 38 y.o. female with: No problem-specific Assessment & Plan notes found for this encounter.  No follow-ups on file.  No orders of the defined types were placed in this encounter.  Lab Orders  No laboratory test(s) ordered today    Diagnostics: Spirometry:  Tracings reviewed. Her effort: {Blank single:19197::"Good reproducible efforts.","It was hard to get consistent efforts and there is a question as to whether  this reflects a maximal maneuver.","Poor effort, data can not be interpreted."} FVC: ***L FEV1: ***L, ***% predicted FEV1/FVC ratio: ***% Interpretation: {Blank single:19197::"Spirometry consistent with mild obstructive disease","Spirometry consistent with moderate obstructive disease","Spirometry consistent with severe obstructive  disease","Spirometry consistent with possible restrictive disease","Spirometry consistent with mixed obstructive and restrictive disease","Spirometry uninterpretable due to technique","Spirometry consistent with normal pattern","No overt abnormalities noted given today's efforts"}.  Please see scanned spirometry results for details.  Skin Testing: {Blank single:19197::"Select foods","Environmental allergy panel","Environmental allergy panel and select foods","Food allergy panel","None","Deferred due to recent antihistamines use"}. Positive test to: ***. Negative test to: ***.  Results discussed with patient/family.   Medication List:  Current Outpatient Medications  Medication Sig Dispense Refill  . albuterol (PROAIR HFA) 108 (90 Base) MCG/ACT inhaler Inhale 90 mcg into the lungs.    Marland Kitchen. azelastine (ASTELIN) 0.1 % nasal spray Place 2 sprays into both nostrils 2 (two) times daily. (Patient not taking: Reported on 01/27/2019) 30 mL 5  . cetirizine (ZYRTEC) 10 MG tablet Take 1 tablet (10 mg total) by mouth daily. 30 tablet 11  . ciprofloxacin-dexamethasone (CIPRODEX) OTIC suspension Place 2 drops into the right ear 4 (four) times daily. 7.5 mL 0  . Crisaborole (EUCRISA) 2 % OINT Apply 1 application topically 2 (two) times daily. To skin flare 100 g 5  . eszopiclone (LUNESTA) 2 MG TABS tablet Take 1 tablet (2 mg total) by mouth at bedtime as needed for sleep. Take immediately before bedtime 15 tablet 1  . fluticasone (FLONASE) 50 MCG/ACT nasal spray Place 1 spray into both nostrils 2 (two) times daily. 16 g 5  . gabapentin (NEURONTIN) 100 MG capsule Take 1 capsule (100 mg total) by mouth 3 (three) times daily. 90 capsule 1  . ibuprofen (ADVIL,MOTRIN) 200 MG tablet Take 200 mg by mouth every 6 (six) hours as needed.    . lamoTRIgine (LAMICTAL) 150 MG tablet Take 1 tablet (150 mg total) by mouth daily. 30 tablet 1  . meclizine (ANTIVERT) 25 MG tablet Take 1 tablet (25 mg total) by mouth 3 (three) times  daily as needed for dizziness. 18 tablet 0  . mometasone-formoterol (DULERA) 100-5 MCG/ACT AERO Inhale 2 puffs into the lungs 2 (two) times daily. 1 Inhaler 3  . montelukast (SINGULAIR) 10 MG tablet Take 10 mg by mouth daily.     No current facility-administered medications for this visit.    Allergies: Allergies  Allergen Reactions  . Ferumoxytol Shortness Of Breath    Nausea, vomiting, and chest pain Nausea, vomiting, and chest pain   . Latex Hives, Rash, Itching and Other (See Comments)    unknown   . Iron Dextran   . Penicillins Other (See Comments)    Unknown--childhood reaction   I reviewed her past medical history, social history, family history, and environmental history and no significant changes have been reported from previous visit on 01/27/2019.  Review of Systems  Constitutional: Negative for appetite change, chills, fever and unexpected weight change.  HENT: Positive for congestion, rhinorrhea and sneezing.   Eyes: Positive for itching.  Respiratory: Negative for cough, chest tightness, shortness of breath and wheezing.   Cardiovascular: Negative for chest pain.  Gastrointestinal: Negative for abdominal pain.  Genitourinary: Negative for difficulty urinating.  Skin: Positive for rash.  Allergic/Immunologic: Positive for environmental allergies. Negative for food allergies.  Neurological: Positive for headaches.   Objective: There were no vitals taken for this visit. There is no height or weight on file to calculate BMI. Physical Exam  Constitutional: She  is oriented to person, place, and time. She appears well-developed and well-nourished.  HENT:  Head: Normocephalic and atraumatic.  Right Ear: External ear normal.  Left Ear: External ear normal.  Mouth/Throat: Oropharynx is clear and moist.  Turbinate hypertrophy bilaterally.  Eyes: Conjunctivae and EOM are normal.  Neck: Neck supple.  Cardiovascular: Normal rate, regular rhythm and normal heart sounds.  Exam reveals no gallop and no friction rub.  No murmur heard. Pulmonary/Chest: Effort normal and breath sounds normal. She has no wheezes. She has no rales.  Abdominal: Soft.  Lymphadenopathy:    She has no cervical adenopathy.  Neurological: She is alert and oriented to person, place, and time.  Skin: Skin is warm. No rash noted.  Psychiatric: She has a normal mood and affect. Her behavior is normal.  Nursing note and vitals reviewed.  Previous notes and tests were reviewed. The plan was reviewed with the patient/family, and all questions/concerned were addressed.  It was my pleasure to see Mackenzie Gomez today and participate in her care. Please feel free to contact me with any questions or concerns.  Sincerely,  Wyline MoodYoon Therasa Lorenzi, DO Allergy & Immunology  [facility] [place] office: [phone] [facility] office: [phone] [place] office: [phone]

## 2019-05-05 ENCOUNTER — Ambulatory Visit (INDEPENDENT_AMBULATORY_CARE_PROVIDER_SITE_OTHER): Payer: Medicaid Other

## 2019-05-05 DIAGNOSIS — J309 Allergic rhinitis, unspecified: Secondary | ICD-10-CM

## 2019-05-12 ENCOUNTER — Encounter: Payer: No Typology Code available for payment source | Admitting: Obstetrics & Gynecology

## 2019-05-14 ENCOUNTER — Ambulatory Visit (INDEPENDENT_AMBULATORY_CARE_PROVIDER_SITE_OTHER): Payer: Medicaid Other | Admitting: Psychiatry

## 2019-05-14 ENCOUNTER — Encounter (HOSPITAL_COMMUNITY): Payer: Self-pay | Admitting: Psychiatry

## 2019-05-14 ENCOUNTER — Other Ambulatory Visit: Payer: Self-pay

## 2019-05-14 DIAGNOSIS — F411 Generalized anxiety disorder: Secondary | ICD-10-CM | POA: Diagnosis not present

## 2019-05-14 DIAGNOSIS — F5101 Primary insomnia: Secondary | ICD-10-CM | POA: Diagnosis not present

## 2019-05-14 DIAGNOSIS — F3130 Bipolar disorder, current episode depressed, mild or moderate severity, unspecified: Secondary | ICD-10-CM | POA: Diagnosis not present

## 2019-05-14 MED ORDER — LAMOTRIGINE 200 MG PO TABS: 200.0000 mg | ORAL_TABLET | Freq: Every day | ORAL | 1 refills | Status: DC

## 2019-05-14 MED ORDER — GABAPENTIN 100 MG PO CAPS: 200.0000 mg | ORAL_CAPSULE | Freq: Three times a day (TID) | ORAL | 1 refills | Status: DC

## 2019-05-14 MED ORDER — ESZOPICLONE 2 MG PO TABS: 2.0000 mg | ORAL_TABLET | Freq: Every evening | ORAL | 1 refills | Status: DC | PRN

## 2019-05-14 NOTE — Progress Notes (Signed)
Virtual Visit via Telephone Note  I connected with Mackenzie Gomez on 05/14/19 at  3:00 PM EDT by telephone and verified that I am speaking with the correct person using two identifiers.   I discussed the limitations, risks, security and privacy concerns of performing an evaluation and management service by telephone and the availability of in person appointments. I also discussed with the patient that there may be a patient responsible charge related to this service. The patient expressed understanding and agreed to proceed.   History of Present Illness: Patient was evaluated by phone session.  She still struggle with insomnia.  We started on gabapentin but when she takes during the day it make her dizzy and groggy.  However she feels it helps the anxiety.  She is also very stressed because she could not get the full-time job and is still working part-time.  Her relationship with the boyfriend did not work out and he moved out.  Patient like to focus on her mental health.  She denies any crying spells but admitted sometimes irritability, impulsive behavior and urged to shop.  So far she has not done any impulsive buying but feel like that she has restlessness, racing thoughts and residual manic symptoms.  She is compliant with Lamictal.  She has no rash, itching, tremors or shakes.  Her energy level is fair.  She denies drinking or using any illegal substances.   Past Psychiatric History:Reviewed. Depression since high school. Tried Ritalin to help her focus but stopped after 1 year. Seen therapist on and off for cutting herself. Tried Prozac, Vistaril, Lexapro, Seroquel and trazodone but stopped as it causes excessive sedation. No history of suicidal attempt or psychiatric inpatient treatment. History of mood swing, depression, anxiety and poor pulse control with excessive spending.Was taking Lexapro but when switched to Lamictal she stopped.   Psychiatric Specialty Exam: Physical Exam  ROS   unknown if currently breastfeeding.There is no height or weight on file to calculate BMI.  General Appearance: NA  Eye Contact:  NA  Speech:  Normal Rate  Volume:  Normal  Mood:  Anxious and Dysphoric  Affect:  NA  Thought Process:  Goal Directed  Orientation:  Full (Time, Place, and Person)  Thought Content:  Rumination  Suicidal Thoughts:  No  Homicidal Thoughts:  No  Memory:  Immediate;   Good Recent;   Good Remote;   Good  Judgement:  Good  Insight:  Present  Psychomotor Activity:  NA  Concentration:  Concentration: Good and Attention Span: Good  Recall:  Good  Fund of Knowledge:  Good  Language:  Good  Akathisia:  No  Handed:  Right  AIMS (if indicated):     Assets:  Communication Skills Desire for Improvement Housing Resilience Social Support  ADL's:  Intact  Cognition:  WNL  Sleep:   poor      Assessment and Plan: Primary insomnia, bipolar disorder type I.  Anxiety disorder.  I reviewed her medication.  She is still struggle with manic symptoms and poor sleep.  I recommend to take gabapentin 200 mg-2 300 mg at bedtime to help insomnia, anxiety.  Increase Lamictal 200 mg daily to help her manic symptoms.  If she is still cannot sleep then she can take Lunesta as needed.  Discussed medication side effects and benefits.  She has not seen therapist due to COVID-19.  I recommend we started virtual therapy and if she is interested then she should let us know.  Discussed safety concern that  anytime having active suicidal thoughts or homicidal thought and she need to call 911 or go to local emergency room.  Follow-up in 6 weeks to 8 weeks.  Follow Up Instructions:    I discussed the assessment and treatment plan with the patient. The patient was provided an opportunity to ask questions and all were answered. The patient agreed with the plan and demonstrated an understanding of the instructions.   The patient was advised to call back or seek an in-person evaluation if  the symptoms worsen or if the condition fails to improve as anticipated.  I provided 20 minutes of non-face-to-face time during this encounter.   Cleotis NipperSyed T Arfeen, MD

## 2019-05-15 ENCOUNTER — Other Ambulatory Visit (HOSPITAL_COMMUNITY): Payer: Self-pay

## 2019-05-15 ENCOUNTER — Ambulatory Visit (INDEPENDENT_AMBULATORY_CARE_PROVIDER_SITE_OTHER): Payer: Medicaid Other

## 2019-05-15 DIAGNOSIS — F411 Generalized anxiety disorder: Secondary | ICD-10-CM

## 2019-05-15 DIAGNOSIS — J309 Allergic rhinitis, unspecified: Secondary | ICD-10-CM

## 2019-05-15 MED ORDER — GABAPENTIN 100 MG PO CAPS: 200.0000 mg | ORAL_CAPSULE | Freq: Every day | ORAL | 1 refills | Status: DC

## 2019-05-26 ENCOUNTER — Ambulatory Visit (INDEPENDENT_AMBULATORY_CARE_PROVIDER_SITE_OTHER): Payer: Medicaid Other | Admitting: *Deleted

## 2019-05-26 DIAGNOSIS — J309 Allergic rhinitis, unspecified: Secondary | ICD-10-CM

## 2019-06-05 ENCOUNTER — Ambulatory Visit (INDEPENDENT_AMBULATORY_CARE_PROVIDER_SITE_OTHER): Payer: Medicaid Other | Admitting: *Deleted

## 2019-06-05 DIAGNOSIS — J309 Allergic rhinitis, unspecified: Secondary | ICD-10-CM | POA: Diagnosis not present

## 2019-06-08 ENCOUNTER — Telehealth: Payer: No Typology Code available for payment source | Admitting: Family

## 2019-06-08 DIAGNOSIS — R109 Unspecified abdominal pain: Secondary | ICD-10-CM

## 2019-06-08 DIAGNOSIS — R399 Unspecified symptoms and signs involving the genitourinary system: Secondary | ICD-10-CM

## 2019-06-08 NOTE — Progress Notes (Signed)
Based on what you shared with me, I feel your condition warrants further evaluation and I recommend that you be seen for a face to face office visit.  Given your back pain and discharge you need to be seen face to face to be evaluated.   NOTE: If you entered your credit card information for this eVisit, you will not be charged. You may see a "hold" on your card for the $35 but that hold will drop off and you will not have a charge processed.  If you are having a true medical emergency please call 911.     For an urgent face to face visit, [place] has five urgent care centers for your convenience:   [email or web address] to reserve your spot online an avoid wait times  [place], [place], [place] [address] *Just off [address], across the road from [place] hours of operation: Monday-Friday, 12 PM to 6 PM  Closed Saturday & Sunday    . [facility]    [phone]                  Get Driving Directions  [address], [place] [address] . 10 am to 8 pm Monday-Friday . 12 pm to 8 pm Saturday-Sunday   . [facility] [place]                  Get Driving Directions  [address], [place], [place] [address] . 8 am to 8 pm Monday-Friday . 9 am to 6 pm Saturday . 11 am to 6 pm Sunday   . [facility] [place]                  Get Driving Directions   [address] [place], [place] [address] . 8 am to 8 pm Monday-Friday . 8 am to 4 pm Saturday-Sunday    . [facility] [place]                    Get Driving Directions  [phone]  [address] [place], [place] [address]  . Monday-Friday, 12 PM to 6 PM    Your e-visit answers were reviewed by a board certified advanced clinical practitioner to complete your personal care plan.  Thank  you for using e-Visits.

## 2019-06-12 ENCOUNTER — Ambulatory Visit (INDEPENDENT_AMBULATORY_CARE_PROVIDER_SITE_OTHER): Payer: Medicaid Other

## 2019-06-12 DIAGNOSIS — H1010 Acute atopic conjunctivitis, unspecified eye: Secondary | ICD-10-CM

## 2019-06-12 DIAGNOSIS — J302 Other seasonal allergic rhinitis: Secondary | ICD-10-CM | POA: Diagnosis not present

## 2019-06-12 DIAGNOSIS — J3089 Other allergic rhinitis: Secondary | ICD-10-CM | POA: Diagnosis not present

## 2019-06-13 ENCOUNTER — Telehealth: Payer: Self-pay

## 2019-06-13 NOTE — Telephone Encounter (Signed)
Left message for patient to call office.  

## 2019-06-13 NOTE — Telephone Encounter (Signed)
Please make a note in her chart. Go back to red 0.3cc at next visit. If no issues then continue to increase by 0.05cc.  Please call patient. She can take benadryl 25-50mg  every 4-6 hours as needed.  Icing area is good. May also use over the counter steroid cream.

## 2019-06-13 NOTE — Telephone Encounter (Signed)
Patient informed of instructions. Note placed on flowsheet.

## 2019-06-13 NOTE — Telephone Encounter (Signed)
Patient called and stated that the whole underside of her right arm is swollen. Patient came in yesterday 06/12/2019 and received. 0.35 of both her first red vials. Patient took benadryl last night and have applied ice packs today. Patient is wondering what else she can do to help reduce the swelling. Please advise.

## 2019-06-26 ENCOUNTER — Ambulatory Visit (INDEPENDENT_AMBULATORY_CARE_PROVIDER_SITE_OTHER): Payer: Medicaid Other | Admitting: *Deleted

## 2019-06-26 DIAGNOSIS — J309 Allergic rhinitis, unspecified: Secondary | ICD-10-CM | POA: Diagnosis not present

## 2019-07-01 DIAGNOSIS — J3089 Other allergic rhinitis: Secondary | ICD-10-CM | POA: Diagnosis not present

## 2019-07-01 NOTE — Progress Notes (Signed)
VIALS EXP 06-30-20 

## 2019-07-08 ENCOUNTER — Encounter (HOSPITAL_COMMUNITY): Payer: Self-pay | Admitting: Psychiatry

## 2019-07-08 ENCOUNTER — Ambulatory Visit (INDEPENDENT_AMBULATORY_CARE_PROVIDER_SITE_OTHER): Payer: Medicaid Other | Admitting: Psychiatry

## 2019-07-08 ENCOUNTER — Other Ambulatory Visit: Payer: Self-pay

## 2019-07-08 DIAGNOSIS — G4700 Insomnia, unspecified: Secondary | ICD-10-CM

## 2019-07-08 DIAGNOSIS — F3130 Bipolar disorder, current episode depressed, mild or moderate severity, unspecified: Secondary | ICD-10-CM | POA: Diagnosis not present

## 2019-07-08 DIAGNOSIS — F411 Generalized anxiety disorder: Secondary | ICD-10-CM

## 2019-07-08 MED ORDER — GABAPENTIN 100 MG PO CAPS: ORAL_CAPSULE | ORAL | 1 refills | Status: DC

## 2019-07-08 MED ORDER — LAMOTRIGINE 200 MG PO TABS: 200.0000 mg | ORAL_TABLET | Freq: Every day | ORAL | 1 refills | Status: DC

## 2019-07-08 NOTE — Progress Notes (Addendum)
Virtual Visit via Telephone Note  I connected with Mackenzie Gomez on 07/08/19 at  3:20 PM EDT by telephone and verified that I am speaking with the correct person using two identifiers.   I discussed the limitations, risks, security and privacy concerns of performing an evaluation and management service by telephone and the availability of in person appointments. I also discussed with the patient that there may be a patient responsible charge related to this service. The patient expressed understanding and agreed to proceed.   History of Present Illness: Patient was evaluated by phone session.  On her last visit we increase gabapentin and Lamictal.  She noticed much improvement in her impulsivity and she does not do any more impulsive buying and shopping but continues to struggle with anxiety and insomnia.  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  She also looking for a full-time job.  Her relationship with boyfriend ended.  She is struggling with insomnia and sometimes if she is lucky she get 6 hours.  She still have some time highs and lows but her manic symptoms are less severe.  She denies any anger or any suicidal thoughts.  She denies any crying spells.  Her energy level is fair.  She lost few pounds since the last visit.  Now she is tolerating much better her gabapentin as in the beginning she was having dizziness.  She is willing to go up on the gabapentin dose.  She has no rash, itching, tremors or shakes from the Lamictal.  Past Psychiatric History:Reviewed. Depression since high school. Tried Ritalin to help her focus but stopped after 1 year. Seen therapist on and off for cutting herself. Tried Prozac, Vistaril, Lexapro, Seroquel and trazodone but stopped as it causes excessive sedation. Buspar made funny. No history of suicidal attempt or psychiatric inpatient treatment. H/O mood swing, depression, anxiety and poor pulse control with excessive  spending.Took Lexapro but when switched to Lamictal she stopped.   Psychiatric Specialty Exam: Physical Exam  ROS  unknown if currently breastfeeding.There is no height or weight on file to calculate BMI.  General Appearance: NA  Eye Contact:  NA  Speech:  Normal Rate  Volume:  Normal  Mood:  Dysphoric  Affect:  NA  Thought Process:  Goal Directed  Orientation:  Full (Time, Place, and Person)  Thought Content:  Rumination  Suicidal Thoughts:  No  Homicidal Thoughts:  No  Memory:  Immediate;   Good Recent;   Good Remote;   Good  Judgement:  Good  Insight:  Fair  Psychomotor Activity:  NA  Concentration:  Concentration: Good and Attention Span: Good  Recall:  Good  Fund of Knowledge:  Good  Language:  Good  Akathisia:  No  Handed:  Right  AIMS (if indicated):     Assets:  Communication Skills Desire for Improvement Housing Transportation  ADL's:  Intact  Cognition:  WNL  Sleep:   5 hours      Assessment and Plan: Primary insomnia, bipolar disorder type I.  Anxiety disorder.  Patient is doing better since dose was adjusted.  She has no rash or itching from the Lamictal.  I will continue Lamictal 200 mg daily.  She is now tolerating better her gabapentin and willing to go up on the dose.  I recommend to try gabapentin 100 mg a day to keep her anxiety under control and she can take gabapentin 300 to 400 mg at bedtime to help insomnia.  She  is not interested in therapy at this time due to COVID-19.  Discussed medication side effects and benefits.  Recommended to call us back if she has any question or concern.  Follow-up in 2 months.  Follow Up Instructions:    I discussed the assessment and treatment plan with the patient. The patient was provided an opportunity to ask questions and all were answered. The patient agreed with the plan and demonstrated an understanding of the instructions.   The patient was advised to call back or seek an in-person evaluation if the  symptoms worsen or if the condition fails to improve as anticipated.  I provided 20 minutes of non-face-to-face time during this encounter.   Kathlee Nations, MD

## 2019-07-11 ENCOUNTER — Ambulatory Visit (INDEPENDENT_AMBULATORY_CARE_PROVIDER_SITE_OTHER): Payer: Medicaid Other

## 2019-07-11 DIAGNOSIS — J309 Allergic rhinitis, unspecified: Secondary | ICD-10-CM

## 2019-07-14 ENCOUNTER — Ambulatory Visit (HOSPITAL_COMMUNITY): Payer: Medicaid Other | Admitting: Psychiatry

## 2019-07-18 ENCOUNTER — Ambulatory Visit (INDEPENDENT_AMBULATORY_CARE_PROVIDER_SITE_OTHER): Payer: Medicaid Other | Admitting: *Deleted

## 2019-07-18 DIAGNOSIS — J309 Allergic rhinitis, unspecified: Secondary | ICD-10-CM

## 2019-07-24 ENCOUNTER — Ambulatory Visit (INDEPENDENT_AMBULATORY_CARE_PROVIDER_SITE_OTHER): Payer: Medicaid Other | Admitting: *Deleted

## 2019-07-24 DIAGNOSIS — J309 Allergic rhinitis, unspecified: Secondary | ICD-10-CM | POA: Diagnosis not present

## 2019-07-25 DIAGNOSIS — F3181 Bipolar II disorder: Secondary | ICD-10-CM | POA: Diagnosis not present

## 2019-08-01 DIAGNOSIS — F3181 Bipolar II disorder: Secondary | ICD-10-CM | POA: Diagnosis not present

## 2019-08-08 DIAGNOSIS — F3181 Bipolar II disorder: Secondary | ICD-10-CM | POA: Diagnosis not present

## 2019-08-12 ENCOUNTER — Ambulatory Visit (INDEPENDENT_AMBULATORY_CARE_PROVIDER_SITE_OTHER): Payer: Medicaid Other

## 2019-08-12 DIAGNOSIS — J309 Allergic rhinitis, unspecified: Secondary | ICD-10-CM

## 2019-08-15 DIAGNOSIS — F3181 Bipolar II disorder: Secondary | ICD-10-CM | POA: Diagnosis not present

## 2019-08-22 ENCOUNTER — Ambulatory Visit (INDEPENDENT_AMBULATORY_CARE_PROVIDER_SITE_OTHER): Payer: Medicaid Other | Admitting: *Deleted

## 2019-08-22 DIAGNOSIS — J309 Allergic rhinitis, unspecified: Secondary | ICD-10-CM

## 2019-08-22 DIAGNOSIS — F3132 Bipolar disorder, current episode depressed, moderate: Secondary | ICD-10-CM | POA: Diagnosis not present

## 2019-08-29 DIAGNOSIS — F3132 Bipolar disorder, current episode depressed, moderate: Secondary | ICD-10-CM | POA: Diagnosis not present

## 2019-09-02 DIAGNOSIS — F3132 Bipolar disorder, current episode depressed, moderate: Secondary | ICD-10-CM | POA: Diagnosis not present

## 2019-09-04 ENCOUNTER — Ambulatory Visit (INDEPENDENT_AMBULATORY_CARE_PROVIDER_SITE_OTHER): Payer: Medicaid Other

## 2019-09-04 DIAGNOSIS — J309 Allergic rhinitis, unspecified: Secondary | ICD-10-CM

## 2019-09-05 DIAGNOSIS — F3132 Bipolar disorder, current episode depressed, moderate: Secondary | ICD-10-CM | POA: Diagnosis not present

## 2019-09-08 ENCOUNTER — Other Ambulatory Visit: Payer: Self-pay

## 2019-09-08 ENCOUNTER — Other Ambulatory Visit (HOSPITAL_COMMUNITY): Payer: Self-pay | Admitting: *Deleted

## 2019-09-08 ENCOUNTER — Encounter (HOSPITAL_COMMUNITY): Payer: Self-pay | Admitting: Psychiatry

## 2019-09-08 ENCOUNTER — Ambulatory Visit (INDEPENDENT_AMBULATORY_CARE_PROVIDER_SITE_OTHER): Payer: Medicaid Other | Admitting: Psychiatry

## 2019-09-08 DIAGNOSIS — F411 Generalized anxiety disorder: Secondary | ICD-10-CM | POA: Diagnosis not present

## 2019-09-08 DIAGNOSIS — Z79899 Other long term (current) drug therapy: Secondary | ICD-10-CM | POA: Diagnosis not present

## 2019-09-08 DIAGNOSIS — F3130 Bipolar disorder, current episode depressed, mild or moderate severity, unspecified: Secondary | ICD-10-CM

## 2019-09-08 DIAGNOSIS — F5101 Primary insomnia: Secondary | ICD-10-CM

## 2019-09-08 MED ORDER — GABAPENTIN 400 MG PO CAPS: 400.0000 mg | ORAL_CAPSULE | Freq: Every day | ORAL | 1 refills | Status: DC

## 2019-09-08 MED ORDER — BUPROPION HCL ER (SR) 100 MG PO TB12: 100.0000 mg | ORAL_TABLET | ORAL | 1 refills | Status: DC

## 2019-09-08 MED ORDER — LAMOTRIGINE 200 MG PO TABS: 200.0000 mg | ORAL_TABLET | Freq: Every day | ORAL | 1 refills | Status: DC

## 2019-09-08 MED ORDER — ESZOPICLONE 2 MG PO TABS: 2.0000 mg | ORAL_TABLET | Freq: Every evening | ORAL | 1 refills | Status: DC | PRN

## 2019-09-08 NOTE — Progress Notes (Signed)
Virtual Visit via Telephone Note  I connected with Mackenzie Gomez on 09/08/19 at  3:20 PM EST by telephone and verified that I am speaking with the correct person using two identifiers.   I discussed the limitations, risks, security and privacy concerns of performing an evaluation and management service by telephone and the availability of in person appointments. I also discussed with the patient that there may be a patient responsible charge related to this service. The patient expressed understanding and agreed to proceed.   History of Present Illness: Patient was evaluated by phone session.  She admitted to stopping the gabapentin and morning but noticed that her anxiety started to get worse.  She also noticed irritability, lack of motivation, focus and difficulty multitasking.  She gets easily forgetful and losing things where she puts.  She has taken Ritalin in the past which helps her focus.  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  She is also doing part-time work.  The patient denies any mania but admitted mood swings irritability and fatigue.  She also struggle with insomnia.  Even though she takes gabapentin 400 mg at bedtime but there are nights when she requires Lunesta for good night sleep.  Now she started [facility] with [facility]  She feels therapy is working.  She denies any hallucination, paranoia or any suicidal thoughts.  Patient admitted that she has difficulty accepting the side effects of the medication.  She also realized that there is no magic medication and she need to tolerate the side effects.  She is requesting something to help her focus, multitasking, energy and motivation.  Her appetite is okay.  Her weight is unchanged from the past.  Patient denies drinking or using any illegal substances.    Past Psychiatric History:Reviewed. Depression since high school. Tried Ritalin to help her focus but stopped after 1 year. Seen therapist on and  off for cutting herself. Tried Prozac, Vistaril, Lexapro, Seroquel and trazodone but stopped due to excessive sedation. Buspar made funny. No h/o suicidal attempt or inpatient treatment. H/O mood swing, depression, anxiety, poor pulse control with excessive spending.Took Lexapro but when switched to Lamictal she stopped.   Psychiatric Specialty Exam: Physical Exam  ROS  unknown if currently breastfeeding.There is no height or weight on file to calculate BMI.  General Appearance: NA  Eye Contact:  NA  Speech:  Clear and Coherent  Volume:  Normal  Mood:  Anxious and Irritable  Affect:  NA  Thought Process:  Goal Directed  Orientation:  Full (Time, Place, and Person)  Thought Content:  Logical  Suicidal Thoughts:  No  Homicidal Thoughts:  No  Memory:  Immediate;   Good Recent;   Good Remote;   Good  Judgement:  Good  Insight:  Fair  Psychomotor Activity:  NA  Concentration:  Concentration: Fair and Attention Span: Fair  Recall:  Fair  Fund of Knowledge:  Good  Language:  Good  Akathisia:  No  Handed:  Right  AIMS (if indicated):     Assets:  Communication Skills Desire for Improvement Housing Resilience Social Support  ADL's:  Intact  Cognition:  WNL  Sleep:   fair      Assessment and Plan: Primary insomnia.  Bipolar disorder type I.  Anxiety disorder.  Discussed medication side effects and benefits.  Recommend to discontinue gabapentin 100 mg in the morning and try Wellbutrin 100 mg to help her focus, attention and anxiety.  Continue gabapentin  400 mg at bedtime, Lamictal 200 mg daily and Lunesta 2 mg for insomnia.  If she feel improvement with the Wellbutrin we will consider adjusting the dose.  Encouraged to continue therapy with Jerilynn Birkenhead.  Recommended to call us back if she is any question or any concern.  Patient has no rash, itching, tremors or shakes.  Follow-up in 2 months.  Patient was evaluated by phone session.  He is stable on his current medication.  He  denies any major panic attack or anxiety attack.  He is sleeping good.  He started exercise at his local  Follow Up Instructions:    I discussed the assessment and treatment plan with the patient. The patient was provided an opportunity to ask questions and all were answered. The patient agreed with the plan and demonstrated an understanding of the instructions.   The patient was advised to call back or seek an in-person evaluation if the symptoms worsen or if the condition fails to improve as anticipated.  I provided 20 minutes of non-face-to-face time during this encounter.   Kathlee Nations, MD

## 2019-09-16 ENCOUNTER — Other Ambulatory Visit (HOSPITAL_COMMUNITY): Payer: Self-pay | Admitting: *Deleted

## 2019-09-17 ENCOUNTER — Ambulatory Visit (INDEPENDENT_AMBULATORY_CARE_PROVIDER_SITE_OTHER): Payer: Medicaid Other

## 2019-09-17 DIAGNOSIS — J309 Allergic rhinitis, unspecified: Secondary | ICD-10-CM | POA: Diagnosis not present

## 2019-09-29 ENCOUNTER — Ambulatory Visit: Payer: Self-pay | Admitting: Family Medicine

## 2019-10-06 DIAGNOSIS — F3132 Bipolar disorder, current episode depressed, moderate: Secondary | ICD-10-CM | POA: Diagnosis not present

## 2019-10-07 ENCOUNTER — Ambulatory Visit (INDEPENDENT_AMBULATORY_CARE_PROVIDER_SITE_OTHER): Payer: Medicaid Other

## 2019-10-07 DIAGNOSIS — J309 Allergic rhinitis, unspecified: Secondary | ICD-10-CM | POA: Diagnosis not present

## 2019-10-24 DIAGNOSIS — F3132 Bipolar disorder, current episode depressed, moderate: Secondary | ICD-10-CM | POA: Diagnosis not present

## 2019-10-28 ENCOUNTER — Ambulatory Visit (INDEPENDENT_AMBULATORY_CARE_PROVIDER_SITE_OTHER): Payer: Medicaid Other

## 2019-10-28 DIAGNOSIS — J309 Allergic rhinitis, unspecified: Secondary | ICD-10-CM | POA: Diagnosis not present

## 2019-11-04 ENCOUNTER — Other Ambulatory Visit: Payer: Self-pay

## 2019-11-04 ENCOUNTER — Encounter (HOSPITAL_COMMUNITY): Payer: Self-pay | Admitting: Psychiatry

## 2019-11-04 ENCOUNTER — Ambulatory Visit (INDEPENDENT_AMBULATORY_CARE_PROVIDER_SITE_OTHER): Payer: Medicaid Other | Admitting: Psychiatry

## 2019-11-04 DIAGNOSIS — F5101 Primary insomnia: Secondary | ICD-10-CM

## 2019-11-04 DIAGNOSIS — F3130 Bipolar disorder, current episode depressed, mild or moderate severity, unspecified: Secondary | ICD-10-CM

## 2019-11-04 DIAGNOSIS — F411 Generalized anxiety disorder: Secondary | ICD-10-CM

## 2019-11-04 MED ORDER — ESZOPICLONE 2 MG PO TABS: 2.0000 mg | ORAL_TABLET | Freq: Every evening | ORAL | 0 refills | Status: DC | PRN

## 2019-11-04 MED ORDER — GABAPENTIN 300 MG PO CAPS: 600.0000 mg | ORAL_CAPSULE | Freq: Every day | ORAL | 1 refills | Status: DC

## 2019-11-04 MED ORDER — BUPROPION HCL ER (XL) 150 MG PO TB24: 150.0000 mg | ORAL_TABLET | Freq: Every day | ORAL | 1 refills | Status: DC

## 2019-11-04 MED ORDER — LAMOTRIGINE 200 MG PO TABS: 200.0000 mg | ORAL_TABLET | Freq: Every day | ORAL | 1 refills | Status: DC

## 2019-11-04 NOTE — Progress Notes (Signed)
Virtual Visit via Telephone Note  I connected with Mackenzie Gomez on 11/04/19 at  3:00 PM EST by telephone and verified that I am speaking with the correct person using two identifiers.   I discussed the limitations, risks, security and privacy concerns of performing an evaluation and management service by telephone and the availability of in person appointments. I also discussed with the patient that there may be a patient responsible charge related to this service. The patient expressed understanding and agreed to proceed.   History of Present Illness: Patient was evaluated by phone session.  On the last visit we started low-dose Wellbutrin to help her focus and attention but she did not see a huge improvement.  However change in gabapentin helped her sleep and anxiety.  She denies any suicidal thoughts and her highs and lows are not as intense and less frequent.  She is in therapy with Jerilynn Birkenhead.  She feels therapy helping.  She was sad because not able to see the kids on the Christmas.  She denies any paranoia, hallucination or any suicidal thoughts.  She takes [place] when she cannot sleep very.  She denies any tremors shakes or any EPS.  She had taken Ritalin in the past to help her focus.  However we tried nonstimulant Wellbutrin to help her focus.  She is willing to try the higher dose.   Wellbutrin did not help her focus and    Past Psychiatric History:Reviewed. H/O depression since high school. Tried Ritalin to help focus but stopped after 1 year. H/O cutting on and off. Tried Prozac, Vistaril, Lexapro, Seroquel and trazodone but stopped due to excessive sedation. Buspar made funny.No h/o suicidal attempt or inpatient treatment. H/Omood swing, depression, anxiety, poor pulse control with excessive spending.TookLexapro but when switched to Lamictal she stopped.   Psychiatric Specialty Exam: Physical Exam  Review of Systems  unknown if currently breastfeeding.There is no height or  weight on file to calculate BMI.  General Appearance: NA  Eye Contact:  NA  Speech:  Clear and Coherent and Normal Rate  Volume:  Normal  Mood:  Anxious  Affect:  NA  Thought Process:  Goal Directed  Orientation:  Full (Time, Place, and Person)  Thought Content:  Rumination  Suicidal Thoughts:  No  Homicidal Thoughts:  No  Memory:  Immediate;   Good Recent;   Good Remote;   Good  Judgement:  Fair  Insight:  Good  Psychomotor Activity:  NA  Concentration:  Concentration: Fair and Attention Span: Fair  Recall:  Good  Fund of Knowledge:  Good  Language:  Good  Akathisia:  No  Handed:  Right  AIMS (if indicated):     Assets:  Communication Skills Desire for Improvement Housing Resilience Social Support  ADL's:  Intact  Cognition:  WNL  Sleep:   better      Assessment and Plan: Primary insomnia.  Bipolar disorder type I.  Anxiety disorder.  Discussed current medication.  She like to increase gabapentin at bedtime and wondering if she can try higher dose to help her sleep much better and help these racing thoughts.  She is also open to try higher dose of Wellbutrin.  She is compliant with medicine and reported no tremors, rash or itching.  Continue Lamictal 200 mg daily which is helping her mood symptoms, try Wellbutrin XL 150 mg in the morning to help her attention and we will increase gabapentin 600 mg at bedtime.  She like to have Lunesta in case  she cannot sleep with higher dose of gabapentin however I reminded that if higher dose of gabapentin help her sleep and anxiety then should not take Lunesta.  We will provide 10 tablets in case she needed.  Encouraged to continue therapy with Anise Salvo.  Recommended to call us back if she has any question of any concern.  Follow-up in 6 weeks.  Follow Up Instructions:    I discussed the assessment and treatment plan with the patient. The patient was provided an opportunity to ask questions and all were answered. The patient agreed  with the plan and demonstrated an understanding of the instructions.   The patient was advised to call back or seek an in-person evaluation if the symptoms worsen or if the condition fails to improve as anticipated.  I provided 20 minutes of non-face-to-face time during this encounter.   Cleotis Nipper, MD

## 2019-11-07 ENCOUNTER — Other Ambulatory Visit (HOSPITAL_COMMUNITY): Payer: Self-pay | Admitting: *Deleted

## 2019-11-07 DIAGNOSIS — F3132 Bipolar disorder, current episode depressed, moderate: Secondary | ICD-10-CM | POA: Diagnosis not present

## 2019-11-07 DIAGNOSIS — F411 Generalized anxiety disorder: Secondary | ICD-10-CM

## 2019-11-07 MED ORDER — GABAPENTIN 300 MG PO CAPS: 600.0000 mg | ORAL_CAPSULE | Freq: Every day | ORAL | 1 refills | Status: DC

## 2019-11-11 ENCOUNTER — Ambulatory Visit (INDEPENDENT_AMBULATORY_CARE_PROVIDER_SITE_OTHER): Payer: Medicaid Other

## 2019-11-11 DIAGNOSIS — J309 Allergic rhinitis, unspecified: Secondary | ICD-10-CM

## 2019-11-18 ENCOUNTER — Ambulatory Visit (INDEPENDENT_AMBULATORY_CARE_PROVIDER_SITE_OTHER): Payer: Medicaid Other

## 2019-11-18 DIAGNOSIS — J309 Allergic rhinitis, unspecified: Secondary | ICD-10-CM | POA: Diagnosis not present

## 2019-11-25 DIAGNOSIS — F3132 Bipolar disorder, current episode depressed, moderate: Secondary | ICD-10-CM | POA: Diagnosis not present

## 2019-11-27 ENCOUNTER — Ambulatory Visit (INDEPENDENT_AMBULATORY_CARE_PROVIDER_SITE_OTHER): Payer: Medicaid Other

## 2019-11-27 DIAGNOSIS — J309 Allergic rhinitis, unspecified: Secondary | ICD-10-CM

## 2019-12-05 ENCOUNTER — Ambulatory Visit (INDEPENDENT_AMBULATORY_CARE_PROVIDER_SITE_OTHER): Payer: Medicaid Other

## 2019-12-05 DIAGNOSIS — J309 Allergic rhinitis, unspecified: Secondary | ICD-10-CM | POA: Diagnosis not present

## 2019-12-16 ENCOUNTER — Other Ambulatory Visit: Payer: Self-pay

## 2019-12-16 ENCOUNTER — Ambulatory Visit (INDEPENDENT_AMBULATORY_CARE_PROVIDER_SITE_OTHER): Payer: Medicaid Other | Admitting: *Deleted

## 2019-12-16 ENCOUNTER — Ambulatory Visit (INDEPENDENT_AMBULATORY_CARE_PROVIDER_SITE_OTHER): Payer: Medicaid Other | Admitting: Psychiatry

## 2019-12-16 ENCOUNTER — Encounter (HOSPITAL_COMMUNITY): Payer: Self-pay | Admitting: Psychiatry

## 2019-12-16 DIAGNOSIS — J309 Allergic rhinitis, unspecified: Secondary | ICD-10-CM

## 2019-12-16 DIAGNOSIS — F411 Generalized anxiety disorder: Secondary | ICD-10-CM

## 2019-12-16 DIAGNOSIS — F5101 Primary insomnia: Secondary | ICD-10-CM

## 2019-12-16 DIAGNOSIS — F3130 Bipolar disorder, current episode depressed, mild or moderate severity, unspecified: Secondary | ICD-10-CM

## 2019-12-16 MED ORDER — ESCITALOPRAM OXALATE 10 MG PO TABS: 10.0000 mg | ORAL_TABLET | Freq: Every day | ORAL | 1 refills | Status: DC

## 2019-12-16 MED ORDER — LAMOTRIGINE 100 MG PO TABS: 250.0000 mg | ORAL_TABLET | Freq: Every day | ORAL | 1 refills | Status: DC

## 2019-12-16 MED ORDER — ESZOPICLONE 2 MG PO TABS: 2.0000 mg | ORAL_TABLET | Freq: Every evening | ORAL | 0 refills | Status: DC | PRN

## 2019-12-16 MED ORDER — GABAPENTIN 300 MG PO CAPS: 600.0000 mg | ORAL_CAPSULE | Freq: Every day | ORAL | 1 refills | Status: DC

## 2019-12-16 NOTE — Progress Notes (Signed)
Virtual Visit via Telephone Note  I connected with Mackenzie Gomez on 12/16/19 at  3:20 PM EST by telephone and verified that I am speaking with the correct person using two identifiers.   I discussed the limitations, risks, security and privacy concerns of performing an evaluation and management service by telephone and the availability of in person appointments. I also discussed with the patient that there may be a patient responsible charge related to this service. The patient expressed understanding and agreed to proceed.   History of Present Illness: Patient was evaluated by phone session.  She noticed since we increased the Wellbutrin she is more emotional and having cycles of ups and downs.  She is more anxious and nervous.  She having mood swings.  She need to take Lunesta more frequently to go to sleep.  She denies any hallucination, paranoia or any suicidal thoughts but feels that she is having trouble controlling her mood.  She has no tremors shakes or any EPS.  She has no rash or any itching.  She is taking Lamictal and gabapentin.  She feels the gabapentin helps calm down but realized Wellbutrin making her more emotional.  She is in therapy with Anise Salvo.   Past Psychiatric History:Reviewed. H/O depression, mood swings, poor impulse control with excessive spending. H/O cutting. Tried Ritalin to help focus but stopped after 1 year. Tried Prozac, Vistaril, Lexapro, Seroquel and trazodone but stoppeddue toexcessive sedation. Buspar made funny. Wellbutrin caused more emotional.  No h/osuicidal attempt or inpatient treatment. TookLexapro which help but stopped when switched to Lamictal.   Psychiatric Specialty Exam: Physical Exam  Review of Systems  unknown if currently breastfeeding.There is no height or weight on file to calculate BMI.  General Appearance: NA  Eye Contact:  NA  Speech:  Normal Rate  Volume:  Normal  Mood:  Anxious, Depressed and Irritable  Affect:  NA  Thought  Process:  Descriptions of Associations: Intact  Orientation:  Full (Time, Place, and Person)  Thought Content:  Rumination  Suicidal Thoughts:  No  Homicidal Thoughts:  No  Memory:  Immediate;   Good Recent;   Good Remote;   Good  Judgement:  Good  Insight:  Good  Psychomotor Activity:  NA  Concentration:  Concentration: Good and Attention Span: Good  Recall:  Good  Fund of Knowledge:  Good  Language:  Good  Akathisia:  No  Handed:  Right  AIMS (if indicated):     Assets:  Communication Skills Desire for Improvement Housing Resilience Transportation  ADL's:  Intact  Cognition:  WNL  Sleep:   better with lunesta      Assessment and Plan: Primary insomnia.  Bipolar disorder type I.  Anxiety.  Discontinue Wellbutrin since it is making her more emotional.  Recommended to try Lamictal 250 mg to help her mood lability.  Patient like to go back on Lexapro which had helped her in the past but it was discontinued when switched to Lamictal and Wellbutrin.  Continue gabapentin 600 mg at bedtime and we will provide Lunesta to help her insomnia.  Encouraged to continue therapy with Anise Salvo.  Follow-up in 6 weeks.  Follow Up Instructions:    I discussed the assessment and treatment plan with the patient. The patient was provided an opportunity to ask questions and all were answered. The patient agreed with the plan and demonstrated an understanding of the instructions.   The patient was advised to call back or seek an in-person evaluation if the  symptoms worsen or if the condition fails to improve as anticipated.  I provided 20 minutes of non-face-to-face time during this encounter.   Kathlee Nations, MD

## 2019-12-23 DIAGNOSIS — F3132 Bipolar disorder, current episode depressed, moderate: Secondary | ICD-10-CM | POA: Diagnosis not present

## 2019-12-25 ENCOUNTER — Ambulatory Visit (INDEPENDENT_AMBULATORY_CARE_PROVIDER_SITE_OTHER): Payer: Medicaid Other

## 2019-12-25 DIAGNOSIS — J309 Allergic rhinitis, unspecified: Secondary | ICD-10-CM

## 2019-12-30 DIAGNOSIS — F3132 Bipolar disorder, current episode depressed, moderate: Secondary | ICD-10-CM | POA: Diagnosis not present

## 2020-01-06 DIAGNOSIS — Z23 Encounter for immunization: Secondary | ICD-10-CM | POA: Diagnosis not present

## 2020-01-06 DIAGNOSIS — F3132 Bipolar disorder, current episode depressed, moderate: Secondary | ICD-10-CM | POA: Diagnosis not present

## 2020-01-20 ENCOUNTER — Telehealth: Payer: Self-pay

## 2020-01-20 ENCOUNTER — Ambulatory Visit (INDEPENDENT_AMBULATORY_CARE_PROVIDER_SITE_OTHER): Payer: Medicaid Other

## 2020-01-20 DIAGNOSIS — J309 Allergic rhinitis, unspecified: Secondary | ICD-10-CM | POA: Diagnosis not present

## 2020-01-20 DIAGNOSIS — F3132 Bipolar disorder, current episode depressed, moderate: Secondary | ICD-10-CM | POA: Diagnosis not present

## 2020-01-20 NOTE — Telephone Encounter (Signed)
Patient received her COVID vaccine on 01-06-2020 (moderna). She says that exactly 1 week after receiving the injection she developed softball size swelling at the injection, itching and redness/rash. She says it resolved within about 4 days. Please advise and thank you. Her next injection in due 02-03-2020.

## 2020-01-21 NOTE — Telephone Encounter (Signed)
Patient and called back and stated that when she had this issue it had been 1 1/2 weeks since her last injection because she was sick. Advised patient about courses to take before receiving her next COVID vaccine. Patient verbalized understanding.

## 2020-01-21 NOTE — Telephone Encounter (Signed)
Called and left a voicemail asking for the patient to return call to discuss.  

## 2020-01-21 NOTE — Telephone Encounter (Signed)
We are more concerned with immediate type of reaction.  Did she get her allergy shot in the same arm around this time or later?  Otherwise, I recommend that she takes zyrtec 10mg  1-2 hours before her next vaccine. Take her epinephrine device with her.  Wait 30 minutes after the injection.

## 2020-01-21 NOTE — Telephone Encounter (Signed)
Call made to patient, no answer. Left message for patient to callback.

## 2020-01-27 DIAGNOSIS — F3132 Bipolar disorder, current episode depressed, moderate: Secondary | ICD-10-CM | POA: Diagnosis not present

## 2020-01-28 ENCOUNTER — Other Ambulatory Visit: Payer: Self-pay

## 2020-01-28 ENCOUNTER — Ambulatory Visit (INDEPENDENT_AMBULATORY_CARE_PROVIDER_SITE_OTHER): Payer: Medicaid Other | Admitting: Psychiatry

## 2020-01-28 ENCOUNTER — Encounter (HOSPITAL_COMMUNITY): Payer: Self-pay | Admitting: Psychiatry

## 2020-01-28 DIAGNOSIS — F5101 Primary insomnia: Secondary | ICD-10-CM

## 2020-01-28 DIAGNOSIS — F411 Generalized anxiety disorder: Secondary | ICD-10-CM

## 2020-01-28 DIAGNOSIS — F3130 Bipolar disorder, current episode depressed, mild or moderate severity, unspecified: Secondary | ICD-10-CM | POA: Diagnosis not present

## 2020-01-28 MED ORDER — ESZOPICLONE 2 MG PO TABS: 2.0000 mg | ORAL_TABLET | Freq: Every evening | ORAL | 1 refills | Status: DC | PRN

## 2020-01-28 MED ORDER — LAMOTRIGINE 200 MG PO TABS: 200.0000 mg | ORAL_TABLET | Freq: Every day | ORAL | 2 refills | Status: AC

## 2020-01-28 MED ORDER — ESCITALOPRAM OXALATE 10 MG PO TABS: 10.0000 mg | ORAL_TABLET | Freq: Every day | ORAL | 2 refills | Status: AC

## 2020-01-28 MED ORDER — GABAPENTIN 300 MG PO CAPS: 600.0000 mg | ORAL_CAPSULE | Freq: Every day | ORAL | 2 refills | Status: DC

## 2020-01-28 NOTE — Progress Notes (Signed)
Virtual Visit via Telephone Note  I connected with Mackenzie Gomez on 01/28/20 at  3:40 PM EDT by telephone and verified that I am speaking with the correct person using two identifiers.   I discussed the limitations, risks, security and privacy concerns of performing an evaluation and management service by telephone and the availability of in person appointments. I also discussed with the patient that there may be a patient responsible charge related to this service. The patient expressed understanding and agreed to proceed.   History of Present Illness: Patient was evaluated by phone session.  On the last visit we discontinued Wellbutrin because she felt it was making her more emotional.  I had recommend to increase Lamictal to 250 mg.  Patient told that she is now feeling numb and she has no emotion.  But denies any highs and lows, irritability or any anger.  She mention her 70 year old cousin died all of a sudden possible due to heart attack and she had difficulty sleeping.  However she does take Lunesta 2-3 times a week which helps her sleep.  She has no rash, itching tremors or shakes.  She is taking gabapentin 600 mg to help with anxiety.  She is in therapy with Anise Salvo.   Past Psychiatric History:Reviewed. H/O depression, mood swings, poor impulse control with excessive spending. H/O cutting. Tried Ritalin to help focus but stopped after 1 year. Tried Prozac, Vistaril, Lexapro, Seroquel and trazodone but stoppeddue toexcessive sedation. Buspar made funny. Wellbutrin caused more emotional.  No h/osuicidal attempt or inpatient treatment. TookLexapro which help but stopped when switched to Lamictal.   Psychiatric Specialty Exam: Physical Exam  Review of Systems  unknown if currently breastfeeding.There is no height or weight on file to calculate BMI.  General Appearance: NA  Eye Contact:  NA  Speech:  Clear and Coherent  Volume:  Normal  Mood:  Euthymic  Affect:  NA  Thought  Process:  Descriptions of Associations: Intact  Orientation:  Full (Time, Place, and Person)  Thought Content:  WDL  Suicidal Thoughts:  No  Homicidal Thoughts:  No  Memory:  Immediate;   Good Recent;   Good Remote;   Good  Judgement:  Intact  Insight:  Present  Psychomotor Activity:  NA  Concentration:  Concentration: Good and Attention Span: Good  Recall:  Good  Fund of Knowledge:  Good  Language:  Good  Akathisia:  No  Handed:  Right  AIMS (if indicated):     Assets:  Communication Skills Desire for Improvement Housing Resilience Transportation  ADL's:  Intact  Cognition:  WNL  Sleep:   ok      Assessment and Plan: Primary insomnia, bipolar disorder, anxiety  Patient is feeling more numb with a higher dose of Lamictal.  Recommended to go back on Lamictal 200 mg which she has taken in the past.  Continue Lunesta 2 mg as needed for insomnia, gabapentin 600 mg at bedtime.  She also noticed sometimes her legs gets restless at night and I recommend to consider talking to PCP to trial of restless leg medication.  She agreed with the plan.  Encouraged to continue Anise Salvo for therapy.  Follow-up in 3 months.    Follow Up Instructions:    I discussed the assessment and treatment plan with the patient. The patient was provided an opportunity to ask questions and all were answered. The patient agreed with the plan and demonstrated an understanding of the instructions.   The patient was advised to  call back or seek an in-person evaluation if the symptoms worsen or if the condition fails to improve as anticipated.  I provided 20 minutes of non-face-to-face time during this encounter.   Kathlee Nations, MD

## 2020-02-03 DIAGNOSIS — Z23 Encounter for immunization: Secondary | ICD-10-CM | POA: Diagnosis not present

## 2020-02-03 DIAGNOSIS — F3132 Bipolar disorder, current episode depressed, moderate: Secondary | ICD-10-CM | POA: Diagnosis not present

## 2020-02-11 ENCOUNTER — Emergency Department (HOSPITAL_COMMUNITY)
Admission: EM | Admit: 2020-02-11 | Discharge: 2020-02-11 | Disposition: A | Discharge status: Home / Self Care | Payer: Medicaid Other | Attending: Emergency Medicine | Admitting: Emergency Medicine

## 2020-02-11 ENCOUNTER — Encounter (HOSPITAL_COMMUNITY): Payer: Self-pay | Admitting: *Deleted

## 2020-02-11 DIAGNOSIS — R109 Unspecified abdominal pain: Secondary | ICD-10-CM | POA: Diagnosis present

## 2020-02-11 DIAGNOSIS — Z5321 Procedure and treatment not carried out due to patient leaving prior to being seen by health care provider: Secondary | ICD-10-CM | POA: Insufficient documentation

## 2020-02-11 LAB — URINALYSIS, ROUTINE W REFLEX MICROSCOPIC
Bilirubin Urine: NEGATIVE
Glucose, UA: NEGATIVE mg/dL
Ketones, ur: 5 mg/dL — AB
Nitrite: NEGATIVE
Protein, ur: NEGATIVE mg/dL
Specific Gravity, Urine: 1.025 (ref 1.005–1.030)
pH: 5 (ref 5.0–8.0)

## 2020-02-11 LAB — CBC
HCT: 38.5 % (ref 36.0–46.0)
Hemoglobin: 12.1 g/dL (ref 12.0–15.0)
MCH: 28 pg (ref 26.0–34.0)
MCHC: 31.4 g/dL (ref 30.0–36.0)
MCV: 89.1 fL (ref 80.0–100.0)
Platelets: 469 10*3/uL — ABNORMAL HIGH (ref 150–400)
RBC: 4.32 MIL/uL (ref 3.87–5.11)
RDW: 13.1 % (ref 11.5–15.5)
WBC: 9.3 10*3/uL (ref 4.0–10.5)
nRBC: 0 % (ref 0.0–0.2)

## 2020-02-11 LAB — BASIC METABOLIC PANEL
Anion gap: 10 (ref 5–15)
BUN: 16 mg/dL (ref 6–20)
CO2: 25 mmol/L (ref 22–32)
Calcium: 10 mg/dL (ref 8.9–10.3)
Chloride: 104 mmol/L (ref 98–111)
Creatinine, Ser: 1.04 mg/dL — ABNORMAL HIGH (ref 0.44–1.00)
GFR calc Af Amer: >60 mL/min (ref >60)
GFR calc non Af Amer: >60 mL/min (ref >60)
Glucose, Bld: 95 mg/dL (ref 70–99)
Potassium: 3.9 mmol/L (ref 3.5–5.1)
Sodium: 139 mmol/L (ref 135–145)

## 2020-02-11 MED ORDER — OXYCODONE-ACETAMINOPHEN 5-325 MG PO TABS
1.0000 | ORAL_TABLET | ORAL | Status: DC | PRN
  Administered 2020-02-11: 1 via ORAL
  Filled 2020-02-11: qty 1

## 2020-02-11 MED ORDER — ONDANSETRON HCL 4 MG PO TABS
4.0000 mg | ORAL_TABLET | Freq: Once | ORAL | Status: AC
  Administered 2020-02-11: 4 mg via ORAL
  Filled 2020-02-11: qty 1

## 2020-02-11 NOTE — ED Triage Notes (Signed)
To ED for eval of left flank pain since waking this am. States she noticed when she first got up but thought she slept wrong - pain worse since. Hx of kidney stones - last being 2 yrs ago. Urinated once today without difficulty. Pt appears uncomfortable. Nausea. No vomiting.

## 2020-02-11 NOTE — ED Notes (Signed)
Called pt x 3 no answer 

## 2020-02-16 DIAGNOSIS — F3132 Bipolar disorder, current episode depressed, moderate: Secondary | ICD-10-CM | POA: Diagnosis not present

## 2020-02-17 ENCOUNTER — Ambulatory Visit (INDEPENDENT_AMBULATORY_CARE_PROVIDER_SITE_OTHER): Payer: Medicaid Other

## 2020-02-17 DIAGNOSIS — J309 Allergic rhinitis, unspecified: Secondary | ICD-10-CM

## 2020-02-24 DIAGNOSIS — F3132 Bipolar disorder, current episode depressed, moderate: Secondary | ICD-10-CM | POA: Diagnosis not present

## 2020-02-26 ENCOUNTER — Ambulatory Visit (INDEPENDENT_AMBULATORY_CARE_PROVIDER_SITE_OTHER): Payer: Medicaid Other

## 2020-02-26 DIAGNOSIS — J309 Allergic rhinitis, unspecified: Secondary | ICD-10-CM

## 2020-03-02 ENCOUNTER — Ambulatory Visit (INDEPENDENT_AMBULATORY_CARE_PROVIDER_SITE_OTHER): Payer: Medicaid Other

## 2020-03-02 DIAGNOSIS — J309 Allergic rhinitis, unspecified: Secondary | ICD-10-CM | POA: Diagnosis not present

## 2020-03-02 DIAGNOSIS — F3132 Bipolar disorder, current episode depressed, moderate: Secondary | ICD-10-CM | POA: Diagnosis not present

## 2020-03-08 ENCOUNTER — Telehealth (HOSPITAL_COMMUNITY): Payer: Self-pay | Admitting: *Deleted

## 2020-03-08 NOTE — Telephone Encounter (Signed)
She is on Lunesta for a while. She tried Trazodone but that did not work.

## 2020-03-08 NOTE — Telephone Encounter (Signed)
PA for Mackenzie Gomez has been denied by [employer]. PA# [id number]. Denial due to pt not having tried and failed 2 preferred meds. Or reason why pt cannot try 2 preferred meds.

## 2020-03-09 NOTE — Telephone Encounter (Signed)
Yes that was submitted. I'll try to appeal

## 2020-03-09 NOTE — Telephone Encounter (Signed)
Ok. If appeal denied than she can try doxepin 10 mg at bed time.

## 2020-03-10 ENCOUNTER — Other Ambulatory Visit (HOSPITAL_COMMUNITY): Payer: Self-pay | Admitting: *Deleted

## 2020-03-10 ENCOUNTER — Telehealth (HOSPITAL_COMMUNITY): Payer: Self-pay | Admitting: *Deleted

## 2020-03-10 NOTE — Telephone Encounter (Signed)
She can try Ambien 5 mg at bed time. Please call her prescription if she agree to try.

## 2020-03-10 NOTE — Telephone Encounter (Signed)
PA for Lunesta resubmitted to [employer] and was denied again as pt has not tried Ambien, Restoril, or Lorazepam. Ref# [id number].

## 2020-03-11 ENCOUNTER — Other Ambulatory Visit (HOSPITAL_COMMUNITY): Payer: Self-pay | Admitting: *Deleted

## 2020-03-11 MED ORDER — ZOLPIDEM TARTRATE 5 MG PO TABS: 5.0000 mg | ORAL_TABLET | Freq: Every evening | ORAL | 1 refills | Status: AC | PRN

## 2020-03-23 DIAGNOSIS — F3132 Bipolar disorder, current episode depressed, moderate: Secondary | ICD-10-CM | POA: Diagnosis not present

## 2020-03-31 DIAGNOSIS — F3132 Bipolar disorder, current episode depressed, moderate: Secondary | ICD-10-CM | POA: Diagnosis not present

## 2020-04-06 DIAGNOSIS — F3132 Bipolar disorder, current episode depressed, moderate: Secondary | ICD-10-CM | POA: Diagnosis not present

## 2020-04-13 DIAGNOSIS — F3132 Bipolar disorder, current episode depressed, moderate: Secondary | ICD-10-CM | POA: Diagnosis not present

## 2020-04-20 DIAGNOSIS — F3132 Bipolar disorder, current episode depressed, moderate: Secondary | ICD-10-CM | POA: Diagnosis not present

## 2020-04-30 DIAGNOSIS — F3162 Bipolar disorder, current episode mixed, moderate: Secondary | ICD-10-CM | POA: Diagnosis not present

## 2020-04-30 DIAGNOSIS — F419 Anxiety disorder, unspecified: Secondary | ICD-10-CM | POA: Diagnosis not present

## 2020-05-06 ENCOUNTER — Telehealth (HOSPITAL_COMMUNITY): Payer: Medicaid Other | Admitting: Psychiatry

## 2020-05-06 ENCOUNTER — Other Ambulatory Visit: Payer: Self-pay

## 2020-05-21 DIAGNOSIS — F3132 Bipolar disorder, current episode depressed, moderate: Secondary | ICD-10-CM | POA: Diagnosis not present

## 2020-06-08 DIAGNOSIS — F3132 Bipolar disorder, current episode depressed, moderate: Secondary | ICD-10-CM | POA: Diagnosis not present

## 2020-06-20 ENCOUNTER — Telehealth: Payer: No Typology Code available for payment source | Admitting: Family

## 2020-06-20 DIAGNOSIS — J069 Acute upper respiratory infection, unspecified: Secondary | ICD-10-CM

## 2020-06-20 MED ORDER — FLUTICASONE PROPIONATE 50 MCG/ACT NA SUSP
2.0000 | Freq: Every day | NASAL | 6 refills | Status: DC
Start: 2020-06-20 — End: 2022-07-14

## 2020-06-20 NOTE — Progress Notes (Signed)

## 2020-06-22 DIAGNOSIS — F3132 Bipolar disorder, current episode depressed, moderate: Secondary | ICD-10-CM | POA: Diagnosis not present

## 2020-06-29 DIAGNOSIS — F3132 Bipolar disorder, current episode depressed, moderate: Secondary | ICD-10-CM | POA: Diagnosis not present

## 2020-07-13 DIAGNOSIS — F3132 Bipolar disorder, current episode depressed, moderate: Secondary | ICD-10-CM | POA: Diagnosis not present

## 2020-07-26 DIAGNOSIS — F3162 Bipolar disorder, current episode mixed, moderate: Secondary | ICD-10-CM | POA: Diagnosis not present

## 2020-07-26 DIAGNOSIS — F419 Anxiety disorder, unspecified: Secondary | ICD-10-CM | POA: Diagnosis not present

## 2020-07-27 DIAGNOSIS — F3132 Bipolar disorder, current episode depressed, moderate: Secondary | ICD-10-CM | POA: Diagnosis not present

## 2020-08-10 DIAGNOSIS — F3132 Bipolar disorder, current episode depressed, moderate: Secondary | ICD-10-CM | POA: Diagnosis not present

## 2020-08-17 DIAGNOSIS — F3132 Bipolar disorder, current episode depressed, moderate: Secondary | ICD-10-CM | POA: Diagnosis not present

## 2020-08-24 DIAGNOSIS — F3132 Bipolar disorder, current episode depressed, moderate: Secondary | ICD-10-CM | POA: Diagnosis not present

## 2020-08-31 DIAGNOSIS — F3132 Bipolar disorder, current episode depressed, moderate: Secondary | ICD-10-CM | POA: Diagnosis not present

## 2020-09-14 DIAGNOSIS — F419 Anxiety disorder, unspecified: Secondary | ICD-10-CM | POA: Diagnosis not present

## 2020-09-14 DIAGNOSIS — F3132 Bipolar disorder, current episode depressed, moderate: Secondary | ICD-10-CM | POA: Diagnosis not present

## 2020-09-27 DIAGNOSIS — F3162 Bipolar disorder, current episode mixed, moderate: Secondary | ICD-10-CM | POA: Diagnosis not present

## 2020-09-27 DIAGNOSIS — F419 Anxiety disorder, unspecified: Secondary | ICD-10-CM | POA: Diagnosis not present

## 2020-10-05 DIAGNOSIS — F419 Anxiety disorder, unspecified: Secondary | ICD-10-CM | POA: Diagnosis not present

## 2020-10-05 DIAGNOSIS — F3132 Bipolar disorder, current episode depressed, moderate: Secondary | ICD-10-CM | POA: Diagnosis not present

## 2020-10-21 DIAGNOSIS — F3132 Bipolar disorder, current episode depressed, moderate: Secondary | ICD-10-CM | POA: Diagnosis not present

## 2020-10-21 DIAGNOSIS — F419 Anxiety disorder, unspecified: Secondary | ICD-10-CM | POA: Diagnosis not present

## 2020-10-28 DIAGNOSIS — F419 Anxiety disorder, unspecified: Secondary | ICD-10-CM | POA: Diagnosis not present

## 2020-10-28 DIAGNOSIS — F3132 Bipolar disorder, current episode depressed, moderate: Secondary | ICD-10-CM | POA: Diagnosis not present

## 2020-11-11 DIAGNOSIS — Z20822 Contact with and (suspected) exposure to covid-19: Secondary | ICD-10-CM | POA: Diagnosis not present

## 2020-11-11 DIAGNOSIS — Z03818 Encounter for observation for suspected exposure to other biological agents ruled out: Secondary | ICD-10-CM | POA: Diagnosis not present

## 2020-11-23 ENCOUNTER — Ambulatory Visit: Payer: Self-pay | Admitting: Family Medicine

## 2020-12-06 DIAGNOSIS — Z20822 Contact with and (suspected) exposure to covid-19: Secondary | ICD-10-CM | POA: Diagnosis not present

## 2020-12-08 ENCOUNTER — Encounter: Payer: Self-pay | Admitting: Family Medicine

## 2020-12-10 DIAGNOSIS — G2571 Drug induced akathisia: Secondary | ICD-10-CM | POA: Diagnosis not present

## 2020-12-10 DIAGNOSIS — F3162 Bipolar disorder, current episode mixed, moderate: Secondary | ICD-10-CM | POA: Diagnosis not present

## 2020-12-10 DIAGNOSIS — F419 Anxiety disorder, unspecified: Secondary | ICD-10-CM | POA: Diagnosis not present

## 2020-12-14 DIAGNOSIS — R7989 Other specified abnormal findings of blood chemistry: Secondary | ICD-10-CM

## 2020-12-14 HISTORY — DX: Other specified abnormal findings of blood chemistry: R79.89

## 2020-12-20 ENCOUNTER — Other Ambulatory Visit: Payer: Self-pay

## 2020-12-20 ENCOUNTER — Ambulatory Visit (INDEPENDENT_AMBULATORY_CARE_PROVIDER_SITE_OTHER): Payer: Medicaid Other | Admitting: Family Medicine

## 2020-12-20 ENCOUNTER — Encounter: Payer: Self-pay | Admitting: Family Medicine

## 2020-12-20 VITALS — BP 98/70 | HR 94 | Ht 62.0 in | Wt 124.0 lb

## 2020-12-20 DIAGNOSIS — F419 Anxiety disorder, unspecified: Secondary | ICD-10-CM

## 2020-12-20 DIAGNOSIS — Z09 Encounter for follow-up examination after completed treatment for conditions other than malignant neoplasm: Secondary | ICD-10-CM | POA: Diagnosis not present

## 2020-12-20 DIAGNOSIS — N921 Excessive and frequent menstruation with irregular cycle: Secondary | ICD-10-CM

## 2020-12-20 DIAGNOSIS — Z0000 Encounter for general adult medical examination without abnormal findings: Secondary | ICD-10-CM | POA: Diagnosis not present

## 2020-12-20 NOTE — Progress Notes (Signed)
[facility]   Re-establish Care  Subjective:  Patient ID: Mackenzie Gomez, female    DOB: 02-01-1981  Age: 40 y.o. MRN: [id number]  CC:  Chief Complaint  Patient presents with  . Annual Exam    HPI Mackenzie Gomez is a 40 year old female who present to Re-establish Care today.    Patient Active Problem List   Diagnosis Date Noted  . Dysmenorrhea 04/01/2019  . Menorrhagia with irregular cycle 04/01/2019  . Seasonal allergies 04/01/2019  . Seasonal and perennial allergic rhinoconjunctivitis 10/28/2018  . Dermatitis 07/26/2018  . Perennial allergic rhinitis with seasonal variation 07/26/2018  . Allergic conjunctivitis, bilateral 07/26/2018  . Mild intermittent asthma without complication 07/26/2018  . History of penicillin allergy 07/26/2018  . Bipolar depression (HCC) 02/13/2018  . Anemia 07/09/2014   Current Status: [name] is re-establishing care. She was previously seen at our clinic for her PCP needs. Since her last office visit, she is doing well with no complaints. She is currently following up regularly with Psychiatry every 2-3 months. She denies suicidal ideations, homicidal ideations, or auditory hallucinations. She denies fevers, chills, fatigue, recent infections, weight loss, and night sweats. She has not had any headaches, visual changes, dizziness, and falls. No chest pain, heart palpitations, cough and shortness of breath reported. No reports of GI problems such as nausea, vomiting, diarrhea, and constipation. She has no reports of blood in stools, dysuria and hematuria. She is taking all medications as prescribed. She denies pain today.   Past Medical History:  Diagnosis Date  . Kidney stone   . Mental disorder   . Mild intermittent asthma 07/26/2018  . Recurrent upper respiratory infection (URI)   . Urinary tract infection     Past Surgical History:  Procedure Laterality Date  . NO PAST SURGERIES    .  TYMPANOSTOMY TUBE PLACEMENT      Family History  Problem Relation Age of Onset  . Diabetes Father   . Heart disease Father   . Hyperlipidemia Father   . Allergic rhinitis Father   . Eczema Father   . Cancer Maternal Grandmother   . Diabetes Paternal Grandfather   . Asthma Neg Hx   . Urticaria Neg Hx     Social History   Socioeconomic History  . Marital status: Divorced    Spouse name: Not on file  . Number of children: Not on file  . Years of education: Not on file  . Highest education level: Not on file  Occupational History  . Not on file  Tobacco Use  . Smoking status: Never Smoker  . Smokeless tobacco: Never Used  Vaping Use  . Vaping Use: Never used  Substance and Sexual Activity  . Alcohol use: No  . Drug use: No  . Sexual activity: Yes  Other Topics Concern  . Not on file  Social History Narrative  . Not on file   Social Determinants of Health   Financial Resource Strain: Not on file  Food Insecurity: Not on file  Transportation Needs: Not on file  Physical Activity: Not on file  Stress: Not on file  Social Connections: Not on file  Intimate Partner Violence: Not on file    Outpatient Medications Prior to Visit  Medication Sig Dispense Refill  . albuterol (VENTOLIN HFA) 108 (90 Base) MCG/ACT inhaler Inhale 90 mcg into the lungs.    . Cariprazine HCl (VRAYLAR PO) Take by mouth. Every other day    .  cetirizine (ZYRTEC) 10 MG tablet Take 1 tablet (10 mg total) by mouth daily. 30 tablet 11  . Crisaborole (EUCRISA) 2 % OINT Apply 1 application topically 2 (two) times daily. To skin flare 100 g 5  . escitalopram (LEXAPRO) 10 MG tablet Take 1 tablet (10 mg total) by mouth daily. 30 tablet 2  . fluticasone (FLONASE) 50 MCG/ACT nasal spray Place 2 sprays into both nostrils daily. 16 g 6  . ibuprofen (ADVIL,MOTRIN) 200 MG tablet Take 200 mg by mouth every 6 (six) hours as needed.    . lamoTRIgine (LAMICTAL) 200 MG tablet Take 1 tablet (200 mg total) by mouth  daily. 30 tablet 2  . mometasone-formoterol (DULERA) 100-5 MCG/ACT AERO Inhale 2 puffs into the lungs 2 (two) times daily. 1 Inhaler 3  . zolpidem (AMBIEN) 5 MG tablet Take 1 tablet (5 mg total) by mouth at bedtime as needed for sleep. 30 tablet 1  . buPROPion (WELLBUTRIN XL) 150 MG 24 hr tablet Take 1 tablet (150 mg total) by mouth daily. 30 tablet 1  . gabapentin (NEURONTIN) 300 MG capsule Take 2 capsules (600 mg total) by mouth at bedtime. 60 capsule 2  . meclizine (ANTIVERT) 25 MG tablet Take 1 tablet (25 mg total) by mouth 3 (three) times daily as needed for dizziness. 18 tablet 0  . montelukast (SINGULAIR) 10 MG tablet Take 10 mg by mouth daily.    Marland Kitchen. azelastine (ASTELIN) 0.1 % nasal spray Place 2 sprays into both nostrils 2 (two) times daily. (Patient not taking: Reported on 01/27/2019) 30 mL 5  . ciprofloxacin-dexamethasone (CIPRODEX) OTIC suspension Place 2 drops into the right ear 4 (four) times daily. 7.5 mL 0   No facility-administered medications prior to visit.    Allergies  Allergen Reactions  . Ferumoxytol Shortness Of Breath    Nausea, vomiting, and chest pain Nausea, vomiting, and chest pain   . Latex Hives, Rash, Itching and Other (See Comments)    unknown   . Iron Dextran   . Penicillins Other (See Comments)    Unknown--childhood reaction    ROS Review of Systems  Constitutional: Negative.   HENT: Negative.   Eyes: Negative.   Respiratory: Negative.   Cardiovascular: Negative.   Gastrointestinal: Negative.   Endocrine: Negative.   Genitourinary: Negative.   Musculoskeletal: Negative.   Skin: Negative.   Allergic/Immunologic: Negative.   Neurological: Negative.   Hematological: Negative.   Psychiatric/Behavioral: Negative.       Objective:    Physical Exam Vitals and nursing note reviewed.  Constitutional:      Appearance: Normal appearance.  HENT:     Head: Normocephalic and atraumatic.     Nose: Nose normal.     Mouth/Throat:     Mouth:  Mucous membranes are moist.     Pharynx: Oropharynx is clear.  Cardiovascular:     Rate and Rhythm: Normal rate and regular rhythm.     Pulses: Normal pulses.     Heart sounds: Normal heart sounds.  Pulmonary:     Effort: Pulmonary effort is normal.     Breath sounds: Normal breath sounds.  Abdominal:     General: Bowel sounds are normal.     Palpations: Abdomen is soft.  Musculoskeletal:        General: Normal range of motion.     Cervical back: Normal range of motion and neck supple.  Skin:    General: Skin is warm and dry.  Neurological:     General: No focal  deficit present.     Mental Status: She is alert and oriented to person, place, and time.  Psychiatric:        Mood and Affect: Mood normal.        Behavior: Behavior normal.        Thought Content: Thought content normal.        Judgment: Judgment normal.     BP 98/70   Pulse 94   Ht 5\' 2"  (1.575 m)   Wt 124 lb (56.2 kg)   SpO2 100%   BMI 22.68 kg/m  Wt Readings from Last 3 Encounters:  12/20/20 124 lb (56.2 kg)  02/11/20 110 lb (49.9 kg)  09/24/18 114 lb (51.7 kg)     Health Maintenance Due  Topic Date Due  . Hepatitis C Screening  Never done  . TETANUS/TDAP  Never done  . PAP SMEAR-Modifier  Never done  . INFLUENZA VACCINE  05/16/2020    There are no preventive care reminders to display for this patient.  Lab Results  Component Value Date   TSH 5.640 (H) 12/20/2020   Lab Results  Component Value Date   WBC 10.8 12/20/2020   HGB 13.8 12/20/2020   HCT 41.5 12/20/2020   MCV 87 12/20/2020   PLT 510 (H) 12/20/2020   Lab Results  Component Value Date   NA 139 02/11/2020   K 3.9 02/11/2020   CO2 25 02/11/2020   GLUCOSE 95 02/11/2020   BUN 16 02/11/2020   CREATININE 1.04 (H) 02/11/2020   BILITOT 0.3 12/19/2017   ALKPHOS 86 12/19/2017   AST 27 12/19/2017   ALT 22 12/19/2017   PROT 7.6 12/19/2017   ALBUMIN 4.7 12/19/2017   CALCIUM 10.0 02/11/2020   ANIONGAP 10 02/11/2020   Lab Results   Component Value Date   CHOL 201 (H) 12/20/2020   Lab Results  Component Value Date   HDL 56 12/20/2020   Lab Results  Component Value Date   LDLCALC 123 (H) 12/20/2020   Lab Results  Component Value Date   TRIG 121 12/20/2020   Lab Results  Component Value Date   CHOLHDL 3.6 12/20/2020   Lab Results  Component Value Date   HGBA1C 5.2 12/19/2017      Assessment & Plan:   1. Annual physical exam Physical assessment within normal for age. Basic Neurology assessment normal.  Follow-up for scheduled mammogram beginning at age 24 yrs old.  Recommend monthly self breast exam Recommend daily multivitamin for women Recommend strength training in 150 minutes of cardiovascular exercise per week  2. Menorrhagia with irregular cycle Stable.   3. Anxiety Stable. She will continue to follow up with Psychiatry as needed.   4. Healthcare maintenance - CBC with Differential - Comprehensive metabolic panel; Future - Lipid Panel - TSH - Vitamin B12 - Vitamin D, 25-hydroxy  5. Follow up She will follow up in 1 year.   No orders of the defined types were placed in this encounter.   Orders Placed This Encounter  Procedures  . CBC with Differential  . Comprehensive metabolic panel  . Lipid Panel  . TSH  . Vitamin B12  . Vitamin D, 25-hydroxy    Referral Orders  No referral(s) requested today    41, MSN, ANE, FNP-BC [facility] [address] [place], [place] [phone] fax   Problem List Items Addressed This Visit      Other  Menorrhagia with irregular cycle    Other Visit Diagnoses    Annual physical exam    -  Primary   Anxiety       Healthcare maintenance       Relevant Orders   CBC with Differential (Completed)   Comprehensive metabolic panel   Lipid Panel (Completed)   TSH (Completed)   Vitamin B12 (Completed)   Vitamin  D, 25-hydroxy (Completed)   Follow up          No orders of the defined types were placed in this encounter.   Follow-up: No follow-ups on file.    Kallie Locks, FNP

## 2020-12-21 LAB — CBC WITH DIFFERENTIAL/PLATELET
Basophils Absolute: 0 10*3/uL (ref 0.0–0.2)
Basos: 0 %
EOS (ABSOLUTE): 0.5 10*3/uL — ABNORMAL HIGH (ref 0.0–0.4)
Eos: 5 %
Hematocrit: 41.5 % (ref 34.0–46.6)
Hemoglobin: 13.8 g/dL (ref 11.1–15.9)
Immature Grans (Abs): 0 10*3/uL (ref 0.0–0.1)
Immature Granulocytes: 0 %
Lymphocytes Absolute: 3.5 10*3/uL — ABNORMAL HIGH (ref 0.7–3.1)
Lymphs: 32 %
MCH: 28.8 pg (ref 26.6–33.0)
MCHC: 33.3 g/dL (ref 31.5–35.7)
MCV: 87 fL (ref 79–97)
Monocytes Absolute: 0.7 10*3/uL (ref 0.1–0.9)
Monocytes: 7 %
Neutrophils Absolute: 6.1 10*3/uL (ref 1.4–7.0)
Neutrophils: 56 %
Platelets: 510 10*3/uL — ABNORMAL HIGH (ref 150–450)
RBC: 4.8 x10E6/uL (ref 3.77–5.28)
RDW: 12.1 % (ref 11.7–15.4)
WBC: 10.8 10*3/uL (ref 3.4–10.8)

## 2020-12-21 LAB — LIPID PANEL
Chol/HDL Ratio: 3.6 ratio (ref 0.0–4.4)
Cholesterol, Total: 201 mg/dL — ABNORMAL HIGH (ref 100–199)
HDL: 56 mg/dL (ref >39)
LDL Chol Calc (NIH): 123 mg/dL — ABNORMAL HIGH (ref 0–99)
Triglycerides: 121 mg/dL (ref 0–149)
VLDL Cholesterol Cal: 22 mg/dL (ref 5–40)

## 2020-12-21 LAB — VITAMIN D 25 HYDROXY (VIT D DEFICIENCY, FRACTURES): Vit D, 25-Hydroxy: 33.7 ng/mL (ref 30.0–100.0)

## 2020-12-21 LAB — TSH: TSH: 5.64 u[IU]/mL — ABNORMAL HIGH (ref 0.450–4.500)

## 2020-12-21 LAB — VITAMIN B12: Vitamin B-12: 209 pg/mL — ABNORMAL LOW (ref 232–1245)

## 2020-12-22 ENCOUNTER — Encounter: Payer: Self-pay | Admitting: Family Medicine

## 2020-12-22 ENCOUNTER — Other Ambulatory Visit: Payer: Self-pay | Admitting: Family Medicine

## 2020-12-22 DIAGNOSIS — R7989 Other specified abnormal findings of blood chemistry: Secondary | ICD-10-CM

## 2021-01-21 ENCOUNTER — Encounter: Payer: Self-pay | Admitting: Family Medicine

## 2021-02-07 DIAGNOSIS — F3162 Bipolar disorder, current episode mixed, moderate: Secondary | ICD-10-CM | POA: Diagnosis not present

## 2021-02-07 DIAGNOSIS — F419 Anxiety disorder, unspecified: Secondary | ICD-10-CM | POA: Diagnosis not present

## 2021-02-07 DIAGNOSIS — G2571 Drug induced akathisia: Secondary | ICD-10-CM | POA: Diagnosis not present

## 2021-08-31 DIAGNOSIS — F3162 Bipolar disorder, current episode mixed, moderate: Secondary | ICD-10-CM | POA: Diagnosis not present

## 2021-08-31 DIAGNOSIS — F419 Anxiety disorder, unspecified: Secondary | ICD-10-CM | POA: Diagnosis not present

## 2021-10-05 DIAGNOSIS — F419 Anxiety disorder, unspecified: Secondary | ICD-10-CM | POA: Diagnosis not present

## 2021-10-05 DIAGNOSIS — F3162 Bipolar disorder, current episode mixed, moderate: Secondary | ICD-10-CM | POA: Diagnosis not present

## 2021-10-05 DIAGNOSIS — G2571 Drug induced akathisia: Secondary | ICD-10-CM | POA: Diagnosis not present

## 2021-10-10 ENCOUNTER — Telehealth: Payer: Medicaid Other | Admitting: Physician Assistant

## 2021-10-10 DIAGNOSIS — J019 Acute sinusitis, unspecified: Secondary | ICD-10-CM

## 2021-10-10 DIAGNOSIS — B9689 Other specified bacterial agents as the cause of diseases classified elsewhere: Secondary | ICD-10-CM | POA: Diagnosis not present

## 2021-10-10 MED ORDER — DOXYCYCLINE HYCLATE 100 MG PO TABS: 100.0000 mg | ORAL_TABLET | Freq: Two times a day (BID) | ORAL | 0 refills | Status: DC

## 2021-10-10 NOTE — Progress Notes (Signed)

## 2021-10-14 ENCOUNTER — Telehealth: Payer: Medicaid Other | Admitting: Physician Assistant

## 2021-10-14 DIAGNOSIS — Z789 Other specified health status: Secondary | ICD-10-CM

## 2021-10-14 DIAGNOSIS — R112 Nausea with vomiting, unspecified: Secondary | ICD-10-CM

## 2021-10-14 MED ORDER — AZITHROMYCIN 250 MG PO TABS: ORAL_TABLET | ORAL | 0 refills | Status: AC

## 2021-10-14 NOTE — Progress Notes (Signed)
I have spent 5 minutes in review of e-visit questionnaire, review and updating patient chart, medical decision making and response to patient.   Bernarr Longsworth Cody Carlean Crowl, PA-C    

## 2021-10-14 NOTE — Progress Notes (Signed)
E-Visit for Vomiting  We are sorry that you are not feeling well. Here is how we plan to help!  Stop the Doxycycline but you can continue the other care recommendations given at time of your visit for sinusitis. I have sent in Azithromycin to take instead for sinusitis.   HOME CARE: Drink clear liquids.  This is very important! Dehydration (the lack of fluid) can lead to a serious complication.  Start off with 1 tablespoon every 5 minutes for 8 hours. You may begin eating bland foods after 8 hours without vomiting.  Start with saltine crackers, white bread, rice, mashed potatoes, applesauce. After 48 hours on a bland diet, you may resume a normal diet. Try to go to sleep.  Sleep often empties the stomach and relieves the need to vomit.  GET HELP RIGHT AWAY IF:  Your symptoms do not improve or worsen within 2 days after treatment. You have a fever for over 3 days. You cannot keep down fluids after trying the medication.  MAKE SURE YOU:  Understand these instructions. Will watch your condition. Will get help right away if you are not doing well or get worse.   Thank you for choosing an e-visit.  Your e-visit answers were reviewed by a board certified advanced clinical practitioner to complete your personal care plan. Depending upon the condition, your plan could have included both over the counter or prescription medications.  Please review your pharmacy choice. Make sure the pharmacy is open so you can pick up prescription now. If there is a problem, you may contact your provider through [employer] and have the prescription routed to another pharmacy.  Your safety is important to [place]. If you have drug allergies check your prescription carefully.   For the next 24 hours you can use MyChart to ask questions about today's visit, request a non-urgent call back, or ask for a work or school excuse. You will get an email in the next two days asking about your experience. I hope that  your e-visit has been valuable and will speed your recovery.

## 2022-01-02 ENCOUNTER — Ambulatory Visit: Payer: Self-pay | Admitting: Nurse Practitioner

## 2022-01-03 ENCOUNTER — Ambulatory Visit: Payer: Self-pay | Admitting: Nurse Practitioner

## 2022-01-30 DIAGNOSIS — F3162 Bipolar disorder, current episode mixed, moderate: Secondary | ICD-10-CM | POA: Diagnosis not present

## 2022-01-30 DIAGNOSIS — F419 Anxiety disorder, unspecified: Secondary | ICD-10-CM | POA: Diagnosis not present

## 2022-03-20 ENCOUNTER — Telehealth: Payer: Medicaid Other | Admitting: Physician Assistant

## 2022-03-20 DIAGNOSIS — J019 Acute sinusitis, unspecified: Secondary | ICD-10-CM | POA: Diagnosis not present

## 2022-03-20 DIAGNOSIS — B9689 Other specified bacterial agents as the cause of diseases classified elsewhere: Secondary | ICD-10-CM | POA: Diagnosis not present

## 2022-03-20 MED ORDER — ALBUTEROL SULFATE HFA 108 (90 BASE) MCG/ACT IN AERS: 1.0000 | INHALATION_SPRAY | Freq: Four times a day (QID) | RESPIRATORY_TRACT | 0 refills | Status: AC | PRN

## 2022-03-20 MED ORDER — DOXYCYCLINE HYCLATE 100 MG PO TABS: 100.0000 mg | ORAL_TABLET | Freq: Two times a day (BID) | ORAL | 0 refills | Status: DC

## 2022-03-20 NOTE — Progress Notes (Signed)
E-Visit for Sinus Problems  We are sorry that you are not feeling well.  Here is how we plan to help!  Based on what you have shared with me it looks like you have sinusitis.  Sinusitis is inflammation and infection in the sinus cavities of the head.  Based on your presentation I believe you most likely have Acute Bacterial Sinusitis.  This is an infection caused by bacteria and is treated with antibiotics. I have prescribed Doxycycline 100mg  by mouth twice a day for 10 days. I have also prescribed an Albuterol inhaler for the wheezing. You may use an oral decongestant such as Mucinex D or if you have glaucoma or high blood pressure use plain Mucinex. Saline nasal spray help and can safely be used as often as needed for congestion.  If you develop worsening sinus pain, fever or notice severe headache and vision changes, or if symptoms are not better after completion of antibiotic, please schedule an appointment with a health care provider.    Sinus infections are not as easily transmitted as other respiratory infection, however we still recommend that you avoid close contact with loved ones, especially the very young and elderly.  Remember to wash your hands thoroughly throughout the day as this is the number one way to prevent the spread of infection!  Home Care: Only take medications as instructed by your medical team. Complete the entire course of an antibiotic. Do not take these medications with alcohol. A steam or ultrasonic humidifier can help congestion.  You can place a towel over your head and breathe in the steam from hot water coming from a faucet. Avoid close contacts especially the very young and the elderly. Cover your mouth when you cough or sneeze. Always remember to wash your hands.  Get Help Right Away If: You develop worsening fever or sinus pain. You develop a severe head ache or visual changes. Your symptoms persist after you have completed your treatment plan.  Make sure  you Understand these instructions. Will watch your condition. Will get help right away if you are not doing well or get worse.  Thank you for choosing an e-visit.  Your e-visit answers were reviewed by a board certified advanced clinical practitioner to complete your personal care plan. Depending upon the condition, your plan could have included both over the counter or prescription medications.  Please review your pharmacy choice. Make sure the pharmacy is open so you can pick up prescription now. If there is a problem, you may contact your provider through and have the prescription routed to another pharmacy.  Your safety is important to [employer]. If you have drug allergies check your prescription carefully.   For the next 24 hours you can use MyChart to ask questions about today's visit, request a non-urgent call back, or ask for a work or school excuse. You will get an email in the next two days asking about your experience. I hope that your e-visit has been valuable and will speed your recovery.  I provided 5 minutes of non face-to-face time during this encounter for chart review and documentation.

## 2022-03-23 ENCOUNTER — Telehealth: Payer: Medicaid Other | Admitting: Physician Assistant

## 2022-03-23 ENCOUNTER — Telehealth: Payer: Medicaid Other | Admitting: Family Medicine

## 2022-03-23 DIAGNOSIS — U071 COVID-19: Secondary | ICD-10-CM

## 2022-03-23 MED ORDER — BENZONATATE 100 MG PO CAPS: 100.0000 mg | ORAL_CAPSULE | Freq: Three times a day (TID) | ORAL | 0 refills | Status: DC | PRN

## 2022-03-23 MED ORDER — MOLNUPIRAVIR EUA 200MG CAPSULE: 4.0000 | ORAL_CAPSULE | Freq: Two times a day (BID) | ORAL | 0 refills | Status: AC

## 2022-03-23 NOTE — Progress Notes (Signed)
Virtual Visit Consent   Mackenzie Gomez, you are scheduled for a virtual visit with a [place] provider today. Just as with appointments in the office, your consent must be obtained to participate. Your consent will be active for this visit and any virtual visit you may have with one of our providers in the next 365 days. If you have a MyChart account, a copy of this consent can be sent to you electronically.  As this is a virtual visit, video technology does not allow for your provider to perform a traditional examination. This may limit your provider's ability to fully assess your condition. If your provider identifies any concerns that need to be evaluated in person or the need to arrange testing (such as labs, EKG, etc.), we will make arrangements to do so. Although advances in technology are sophisticated, we cannot ensure that it will always work on either your end or our end. If the connection with a video visit is poor, the visit may have to be switched to a telephone visit. With either a video or telephone visit, we are not always able to ensure that we have a secure connection.  By engaging in this virtual visit, you consent to the provision of healthcare and authorize for your insurance to be billed (if applicable) for the services provided during this visit. Depending on your insurance coverage, you may receive a charge related to this service.  I need to obtain your verbal consent now. Are you willing to proceed with your visit today? Mackenzie Gomez has provided verbal consent on 03/23/2022 for a virtual visit (video or telephone). Piedad Climes, [place]  Date: 03/23/2022 4:05 PM  Virtual Visit via Video Note   I, Piedad Climes, connected with  Mackenzie Gomez  ([id number], Oct 19, 1980) on 03/23/22 at  4:00 PM EDT by a video-enabled telemedicine application and verified that I am speaking with the correct person using two identifiers.  Location: Patient: Virtual Visit Location Patient:  Home Provider: Virtual Visit Location Provider: Home Office   I discussed the limitations of evaluation and management by telemedicine and the availability of in person appointments. The patient expressed understanding and agreed to proceed.    History of Present Illness: Mackenzie Gomez is a 41 y.o. who identifies as a female who was assigned female at birth, and is being seen today for COVID-19. Patient endorses symptoms starting Monday with nasal and head congestion, low-grade fever, fatigue and cough. Thought was sinus infection as she gets those often was seen via e-visit and started on medication regimen. Notes symptoms progressed so took home COVID test which was positive. Denies any chest pain or SOB. Denies any significant GI symptoms.   HPI: HPI  Problems:  Patient Active Problem List   Diagnosis Date Noted   Dysmenorrhea 04/01/2019   Menorrhagia with irregular cycle 04/01/2019   Seasonal allergies 04/01/2019   Seasonal and perennial allergic rhinoconjunctivitis 10/28/2018   Dermatitis 07/26/2018   Perennial allergic rhinitis with seasonal variation 07/26/2018   Allergic conjunctivitis, bilateral 07/26/2018   Mild intermittent asthma without complication 07/26/2018   History of penicillin allergy 07/26/2018   Bipolar depression (HCC) 02/13/2018   Anemia 07/09/2014    Allergies:  Allergies  Allergen Reactions   Ferumoxytol Shortness Of Breath    Nausea, vomiting, and chest pain Nausea, vomiting, and chest pain    Latex Hives, Rash, Itching and Other (See Comments)    unknown    Iron Dextran    Penicillins Other (See Comments)  Unknown--childhood reaction   Medications:  Current Outpatient Medications:    benzonatate (TESSALON) 100 MG capsule, Take 1 capsule (100 mg total) by mouth 3 (three) times daily as needed for cough., Disp: 30 capsule, Rfl: 0   molnupiravir EUA (LAGEVRIO) 200 mg CAPS capsule, Take 4 capsules (800 mg total) by mouth 2 (two) times daily for 5 days.,  Disp: 40 capsule, Rfl: 0   albuterol (VENTOLIN HFA) 108 (90 Base) MCG/ACT inhaler, Inhale 1-2 puffs into the lungs every 6 (six) hours as needed for wheezing or shortness of breath., Disp: 18 g, Rfl: 0   Cariprazine HCl (VRAYLAR PO), Take by mouth. Every other day, Disp: , Rfl:    cetirizine (ZYRTEC) 10 MG tablet, Take 1 tablet (10 mg total) by mouth daily., Disp: 30 tablet, Rfl: 11   Crisaborole (EUCRISA) 2 % OINT, Apply 1 application topically 2 (two) times daily. To skin flare, Disp: 100 g, Rfl: 5   doxycycline (VIBRA-TABS) 100 MG tablet, Take 1 tablet (100 mg total) by mouth 2 (two) times daily., Disp: 20 tablet, Rfl: 0   escitalopram (LEXAPRO) 10 MG tablet, Take 1 tablet (10 mg total) by mouth daily., Disp: 30 tablet, Rfl: 2   fluticasone (FLONASE) 50 MCG/ACT nasal spray, Place 2 sprays into both nostrils daily., Disp: 16 g, Rfl: 6   ibuprofen (ADVIL,MOTRIN) 200 MG tablet, Take 200 mg by mouth every 6 (six) hours as needed., Disp: , Rfl:    lamoTRIgine (LAMICTAL) 200 MG tablet, Take 1 tablet (200 mg total) by mouth daily., Disp: 30 tablet, Rfl: 2   mometasone-formoterol (DULERA) 100-5 MCG/ACT AERO, Inhale 2 puffs into the lungs 2 (two) times daily., Disp: 1 Inhaler, Rfl: 3   zolpidem (AMBIEN) 5 MG tablet, Take 1 tablet (5 mg total) by mouth at bedtime as needed for sleep., Disp: 30 tablet, Rfl: 1  Observations/Objective: Patient is well-developed, well-nourished in no acute distress.  Resting comfortably at home.  Head is normocephalic, atraumatic.  No labored breathing. Speech is clear and coherent with logical content.  Patient is alert and oriented at baseline.   Assessment and Plan: 1. COVID-19 - MyChart COVID-19 home monitoring program; Future - benzonatate (TESSALON) 100 MG capsule; Take 1 capsule (100 mg total) by mouth 3 (three) times daily as needed for cough.  Dispense: 30 capsule; Refill: 0 - molnupiravir EUA (LAGEVRIO) 200 mg CAPS capsule; Take 4 capsules (800 mg total) by  mouth 2 (two) times daily for 5 days.  Dispense: 40 capsule; Refill: 0  Patient with multiple risk factors for complicated course of illness. Discussed risks/benefits of antiviral medications including most common potential ADRs. Patient voiced understanding and would like to proceed with antiviral medication. They are candidate for molnupiravir. Rx sent to pharmacy. Supportive measures, OTC medications and vitamin regimen reviewed. Tessalon per orders. Patient has been enrolled in a MyChart COVID symptom monitoring program. Anne Shutter reviewed in detail. Strict ER precautions discussed with patient.    Follow Up Instructions: I discussed the assessment and treatment plan with the patient. The patient was provided an opportunity to ask questions and all were answered. The patient agreed with the plan and demonstrated an understanding of the instructions.  A copy of instructions were sent to the patient via MyChart unless otherwise noted below.   The patient was advised to call back or seek an in-person evaluation if the symptoms worsen or if the condition fails to improve as anticipated.  Time:  I spent 10 minutes with the patient via telehealth technology  discussing the above problems/concerns.    Piedad Climes, PA-C

## 2022-03-23 NOTE — Progress Notes (Signed)
Mackenzie Gomez   Covid + needs VV Message sent

## 2022-03-23 NOTE — Patient Instructions (Signed)
Mackenzie Gomez, thank you for joining Leeanne Rio, PA-C for today's virtual visit.  While this provider is not your primary care provider (PCP), if your PCP is located in our provider database this encounter information will be shared with them immediately following your visit.  Consent: (Patient) Mackenzie Gomez provided verbal consent for this virtual visit at the beginning of the encounter.  Current Medications:  Current Outpatient Medications:    albuterol (VENTOLIN HFA) 108 (90 Base) MCG/ACT inhaler, Inhale 1-2 puffs into the lungs every 6 (six) hours as needed for wheezing or shortness of breath., Disp: 18 g, Rfl: 0   Cariprazine HCl (VRAYLAR PO), Take by mouth. Every other day, Disp: , Rfl:    cetirizine (ZYRTEC) 10 MG tablet, Take 1 tablet (10 mg total) by mouth daily., Disp: 30 tablet, Rfl: 11   Crisaborole (EUCRISA) 2 % OINT, Apply 1 application topically 2 (two) times daily. To skin flare, Disp: 100 g, Rfl: 5   doxycycline (VIBRA-TABS) 100 MG tablet, Take 1 tablet (100 mg total) by mouth 2 (two) times daily., Disp: 20 tablet, Rfl: 0   escitalopram (LEXAPRO) 10 MG tablet, Take 1 tablet (10 mg total) by mouth daily., Disp: 30 tablet, Rfl: 2   fluticasone (FLONASE) 50 MCG/ACT nasal spray, Place 2 sprays into both nostrils daily., Disp: 16 g, Rfl: 6   ibuprofen (ADVIL,MOTRIN) 200 MG tablet, Take 200 mg by mouth every 6 (six) hours as needed., Disp: , Rfl:    lamoTRIgine (LAMICTAL) 200 MG tablet, Take 1 tablet (200 mg total) by mouth daily., Disp: 30 tablet, Rfl: 2   mometasone-formoterol (DULERA) 100-5 MCG/ACT AERO, Inhale 2 puffs into the lungs 2 (two) times daily., Disp: 1 Inhaler, Rfl: 3   zolpidem (AMBIEN) 5 MG tablet, Take 1 tablet (5 mg total) by mouth at bedtime as needed for sleep., Disp: 30 tablet, Rfl: 1   Medications ordered in this encounter:  No orders of the defined types were placed in this encounter.    *If you need refills on other medications prior to your next  appointment, please contact your pharmacy*  Follow-Up: Call back or seek an in-person evaluation if the symptoms worsen or if the condition fails to improve as anticipated.  Other Instructions Please keep well-hydrated and get plenty of rest. Start a saline nasal rinse to flush out your nasal passages. You can use plain Mucinex to help thin congestion. If you have a humidifier, running in the bedroom at night. I want you to start OTC vitamin D3 1000 units daily, vitamin C 1000 mg daily, and a zinc supplement. Please take prescribed medications as directed.  You have been enrolled in a MyChart symptom monitoring program. Please answer these questions daily so we can keep track of how you are doing.  You were to quarantine for 5 days from onset of your symptoms.  After day 5, if you have had no fever and you are feeling better, you can end quarantine but need to mask for an additional 5 days. After day 5 if you have a fever or are having significant symptoms, please quarantine for full 10 days.  If you note any worsening of symptoms, any significant shortness of breath or any chest pain, please seek ER evaluation ASAP.  Please do not delay care!  COVID-19: What to Do if You Are Sick If you test positive and are an older adult or someone who is at high risk of getting very sick from COVID-19, treatment may be available. Contact  a healthcare provider right away after a positive test to determine if you are eligible, even if your symptoms are mild right now. You can also visit a Test to Treat location and, if eligible, receive a prescription from a provider. Don't delay: Treatment must be started within the first few days to be effective. If you have a fever, cough, or other symptoms, you might have COVID-19. Most people have mild illness and are able to recover at home. If you are sick: Keep track of your symptoms. If you have an emergency warning sign (including trouble breathing), call  911. Steps to help prevent the spread of COVID-19 if you are sick If you are sick with COVID-19 or think you might have COVID-19, follow the steps below to care for yourself and to help protect other people in your home and community. Stay home except to get medical care Stay home. Most people with COVID-19 have mild illness and can recover at home without medical care. Do not leave your home, except to get medical care. Do not visit public areas and do not go to places where you are unable to wear a mask. Take care of yourself. Get rest and stay hydrated. Take over-the-counter medicines, such as acetaminophen, to help you feel better. Stay in touch with your doctor. Call before you get medical care. Be sure to get care if you have trouble breathing, or have any other emergency warning signs, or if you think it is an emergency. Avoid public transportation, ride-sharing, or taxis if possible. Get tested If you have symptoms of COVID-19, get tested. While waiting for test results, stay away from others, including staying apart from those living in your household. Get tested as soon as possible after your symptoms start. Treatments may be available for people with COVID-19 who are at risk for becoming very sick. Don't delay: Treatment must be started early to be effective--some treatments must begin within 5 days of your first symptoms. Contact your healthcare provider right away if your test result is positive to determine if you are eligible. Self-tests are one of several options for testing for the virus that causes COVID-19 and may be more convenient than laboratory-based tests and point-of-care tests. Ask your healthcare provider or your local health department if you need help interpreting your test results. You can visit your state, tribal, local, and territorial health department's website to look for the latest local information on testing sites. Separate yourself from other people As much as  possible, stay in a specific room and away from other people and pets in your home. If possible, you should use a separate bathroom. If you need to be around other people or animals in or outside of the home, wear a well-fitting mask. Tell your close contacts that they may have been exposed to COVID-19. An infected person can spread COVID-19 starting 48 hours (or 2 days) before the person has any symptoms or tests positive. By letting your close contacts know they may have been exposed to COVID-19, you are helping to protect everyone. See COVID-19 and Animals if you have questions about pets. If you are diagnosed with COVID-19, someone from the health department may call you. Answer the call to slow the spread. Monitor your symptoms Symptoms of COVID-19 include fever, cough, or other symptoms. Follow care instructions from your healthcare provider and local health department. Your local health authorities may give instructions on checking your symptoms and reporting information. When to seek emergency medical attention Look for  emergency warning signs* for COVID-19. If someone is showing any of these signs, seek emergency medical care immediately: Trouble breathing Persistent pain or pressure in the chest New confusion Inability to wake or stay awake Pale, gray, or blue-colored skin, lips, or nail beds, depending on skin tone *This list is not all possible symptoms. Please call your medical provider for any other symptoms that are severe or concerning to you. Call 911 or call ahead to your local emergency facility: Notify the operator that you are seeking care for someone who has or may have COVID-19. Call ahead before visiting your doctor Call ahead. Many medical visits for routine care are being postponed or done by phone or telemedicine. If you have a medical appointment that cannot be postponed, call your doctor's office, and tell them you have or may have COVID-19. This will help the office  protect themselves and other patients. If you are sick, wear a well-fitting mask You should wear a mask if you must be around other people or animals, including pets (even at home). Wear a mask with the best fit, protection, and comfort for you. You don't need to wear the mask if you are alone. If you can't put on a mask (because of trouble breathing, for example), cover your coughs and sneezes in some other way. Try to stay at least 6 feet away from other people. This will help protect the people around you. Masks should not be placed on young children under age 32 years, anyone who has trouble breathing, or anyone who is not able to remove the mask without help. Cover your coughs and sneezes Cover your mouth and nose with a tissue when you cough or sneeze. Throw away used tissues in a lined trash can. Immediately wash your hands with soap and water for at least 20 seconds. If soap and water are not available, clean your hands with an alcohol-based hand sanitizer that contains at least 60% alcohol. Clean your hands often Wash your hands often with soap and water for at least 20 seconds. This is especially important after blowing your nose, coughing, or sneezing; going to the bathroom; and before eating or preparing food. Use hand sanitizer if soap and water are not available. Use an alcohol-based hand sanitizer with at least 60% alcohol, covering all surfaces of your hands and rubbing them together until they feel dry. Soap and water are the best option, especially if hands are visibly dirty. Avoid touching your eyes, nose, and mouth with unwashed hands. Handwashing Tips Avoid sharing personal household items Do not share dishes, drinking glasses, cups, eating utensils, towels, or bedding with other people in your home. Wash these items thoroughly after using them with soap and water or put in the dishwasher. Clean surfaces in your home regularly Clean and disinfect high-touch surfaces (for  example, doorknobs, tables, handles, light switches, and countertops) in your "sick room" and bathroom. In shared spaces, you should clean and disinfect surfaces and items after each use by the person who is ill. If you are sick and cannot clean, a caregiver or other person should only clean and disinfect the area around you (such as your bedroom and bathroom) on an as needed basis. Your caregiver/other person should wait as long as possible (at least several hours) and wear a mask before entering, cleaning, and disinfecting shared spaces that you use. Clean and disinfect areas that may have blood, stool, or body fluids on them. Use household cleaners and disinfectants. Clean visible dirty surfaces  with household cleaners containing soap or detergent. Then, use a household disinfectant. Use a product from [employer] List N: Disinfectants for Coronavirus ([id number]). Be sure to follow the instructions on the label to ensure safe and effective use of the product. Many products recommend keeping the surface wet with a disinfectant for a certain period of time (look at "contact time" on the product label). You may also need to wear personal protective equipment, such as gloves, depending on the directions on the product label. Immediately after disinfecting, wash your hands with soap and water for 20 seconds. For completed guidance on cleaning and disinfecting your home, visit Complete Disinfection Guidance. Take steps to improve ventilation at home Improve ventilation (air flow) at home to help prevent from spreading COVID-19 to other people in your household. Clear out COVID-19 virus particles in the air by opening windows, using air filters, and turning on fans in your home. Use this interactive tool to learn how to improve air flow in your home. When you can be around others after being sick with COVID-19 Deciding when you can be around others is different for different situations. Find out when you can  safely end home isolation. For any additional questions about your care, contact your healthcare provider or state or local health department. 01/04/2021 Content source: [facility] ([facility]), Division of Viral Diseases This information is not intended to replace advice given to you by your health care provider. Make sure you discuss any questions you have with your health care provider. Document Revised: 02/17/2021 Document Reviewed: 02/17/2021 Elsevier Patient Education  2022 [employer].       If you have been instructed to have an in-person evaluation today at a local Urgent Care facility, please use the link below. It will take you to a list of all of our available [facility], including address, phone number and hours of operation. Please do not delay care.  [facility]  If you or a family member do not have a primary care provider, use the link below to schedule a visit and establish care. When you choose a [facility] primary care physician or advanced practice provider, you gain a long-term partner in health. Find a Primary Care Provider  Learn more about [facility]'s in-office and virtual care options: [facility]

## 2022-04-07 ENCOUNTER — Telehealth: Payer: Medicaid Other | Admitting: Physician Assistant

## 2022-04-07 DIAGNOSIS — B9689 Other specified bacterial agents as the cause of diseases classified elsewhere: Secondary | ICD-10-CM

## 2022-04-07 DIAGNOSIS — J019 Acute sinusitis, unspecified: Secondary | ICD-10-CM | POA: Diagnosis not present

## 2022-04-07 MED ORDER — DOXYCYCLINE HYCLATE 100 MG PO TABS: 100.0000 mg | ORAL_TABLET | Freq: Two times a day (BID) | ORAL | 0 refills | Status: DC

## 2022-05-02 ENCOUNTER — Telehealth: Payer: Medicaid Other | Admitting: Physician Assistant

## 2022-05-02 DIAGNOSIS — J329 Chronic sinusitis, unspecified: Secondary | ICD-10-CM

## 2022-05-02 NOTE — Progress Notes (Signed)
Because of frequent bouts of sinus inflammation/infection within the past month, it is our policy to have you evaluated in person for examination. Please contact your primary care physician practice to be seen. Many offices offer virtual options to be seen via video if you are not comfortable going in person to a medical facility at this time.  NOTE: You will NOT be charged for this eVisit.  If you do not have a PCP, [facility] offers a free physician referral service available at [phone]. Our trained staff has the experience, knowledge and resources to put you in touch with a physician who is right for you.    If you are having a true medical emergency please call 911.   Your e-visit answers were reviewed by a board certified advanced clinical practitioner to complete your personal care plan.  Thank you for using e-Visits.

## 2022-05-29 ENCOUNTER — Telehealth: Payer: Medicaid Other | Admitting: Nurse Practitioner

## 2022-05-29 DIAGNOSIS — J329 Chronic sinusitis, unspecified: Secondary | ICD-10-CM | POA: Diagnosis not present

## 2022-05-29 MED ORDER — FLUTICASONE PROPIONATE 50 MCG/ACT NA SUSP: 2.0000 | Freq: Every day | NASAL | 6 refills | Status: DC

## 2022-05-29 NOTE — Progress Notes (Signed)
E-Visit for Sinus Problems  Mackenzie Gomez,  After reviewing your chart it seems you are having recurrent sinusitis symptoms. This can many times be caused by uncontrolled allergies or other chronic sinus issues.   Our best recommendation is to treat this as viral, and to recommend you see an Ear Nose and Throat specialist.   Recurrently taking antibiotics is not a good idea, and since you are allergic to penicillin your antibiotic choices are already limited. The more times you take an antibiotic the less effective it can be.   You can send a mychart message to your primary care and ask for assistance in finding an ENT specialist. They will also be copied on this note:  Based on what you have shared with me it looks like you have sinusitis.  Sinusitis is inflammation and infection in the sinus cavities of the head.  Based on your presentation I believe you most likely have Acute Viral Sinusitis.This is an infection most likely caused by a virus. There is not specific treatment for viral sinusitis other than to help you with the symptoms until the infection runs its course.  You may use an oral decongestant such as Mucinex D or if you have glaucoma or high blood pressure use plain Mucinex. Saline nasal spray help and can safely be used as often as needed for congestion, I have prescribed: Fluticasone nasal spray two sprays in each nostril once a day  Some authorities believe that zinc sprays or the use of Echinacea may shorten the course of your symptoms.  Sinus infections are not as easily transmitted as other respiratory infection, however we still recommend that you avoid close contact with loved ones, especially the very young and elderly.  Remember to wash your hands thoroughly throughout the day as this is the number one way to prevent the spread of infection!  Home Care: Only take medications as instructed by your medical team. Do not take these medications with alcohol. A steam or ultrasonic  humidifier can help congestion.  You can place a towel over your head and breathe in the steam from hot water coming from a faucet. Avoid close contacts especially the very young and the elderly. Cover your mouth when you cough or sneeze. Always remember to wash your hands.  Get Help Right Away If: You develop worsening fever or sinus pain. You develop a severe head ache or visual changes. Your symptoms persist after you have completed your treatment plan.  Make sure you Understand these instructions. Will watch your condition. Will get help right away if you are not doing well or get worse.   Thank you for choosing an e-visit.  Your e-visit answers were reviewed by a board certified advanced clinical practitioner to complete your personal care plan. Depending upon the condition, your plan could have included both over the counter or prescription medications.  Please review your pharmacy choice. Make sure the pharmacy is open so you can pick up prescription now. If there is a problem, you may contact your provider through [employer] and have the prescription routed to another pharmacy.  Your safety is important to [place]. If you have drug allergies check your prescription carefully.   For the next 24 hours you can use MyChart to ask questions about today's visit, request a non-urgent call back, or ask for a work or school excuse. You will get an email in the next two days asking about your experience. I hope that your e-visit has been valuable and will  speed your recovery.  I spent approximately 5 minutes reviewing the patient's history, current symptoms and coordinating their plan of care today.

## 2022-06-13 ENCOUNTER — Telehealth: Payer: Medicaid Other | Admitting: Family

## 2022-06-13 DIAGNOSIS — H109 Unspecified conjunctivitis: Secondary | ICD-10-CM

## 2022-06-13 MED ORDER — POLYMYXIN B-TRIMETHOPRIM 10000-0.1 UNIT/ML-% OP SOLN: 1.0000 [drp] | Freq: Four times a day (QID) | OPHTHALMIC | 0 refills | Status: DC

## 2022-06-13 NOTE — Progress Notes (Signed)

## 2022-07-14 ENCOUNTER — Telehealth: Payer: Medicaid Other | Admitting: Physician Assistant

## 2022-07-14 DIAGNOSIS — J069 Acute upper respiratory infection, unspecified: Secondary | ICD-10-CM

## 2022-07-14 MED ORDER — IPRATROPIUM BROMIDE 0.03 % NA SOLN: 2.0000 | Freq: Two times a day (BID) | NASAL | 0 refills | Status: DC

## 2022-07-14 MED ORDER — BENZONATATE 100 MG PO CAPS: 100.0000 mg | ORAL_CAPSULE | Freq: Three times a day (TID) | ORAL | 0 refills | Status: DC | PRN

## 2022-07-14 NOTE — Progress Notes (Signed)

## 2022-07-16 DIAGNOSIS — J029 Acute pharyngitis, unspecified: Secondary | ICD-10-CM | POA: Diagnosis not present

## 2022-07-16 DIAGNOSIS — R509 Fever, unspecified: Secondary | ICD-10-CM | POA: Diagnosis not present

## 2022-07-27 DIAGNOSIS — G2571 Drug induced akathisia: Secondary | ICD-10-CM | POA: Diagnosis not present

## 2022-07-27 DIAGNOSIS — F3162 Bipolar disorder, current episode mixed, moderate: Secondary | ICD-10-CM | POA: Diagnosis not present

## 2022-07-27 DIAGNOSIS — F419 Anxiety disorder, unspecified: Secondary | ICD-10-CM | POA: Diagnosis not present

## 2022-08-13 ENCOUNTER — Telehealth: Payer: Medicaid Other | Admitting: Family

## 2022-08-13 DIAGNOSIS — H938X3 Other specified disorders of ear, bilateral: Secondary | ICD-10-CM | POA: Diagnosis not present

## 2022-08-13 DIAGNOSIS — R42 Dizziness and giddiness: Secondary | ICD-10-CM

## 2022-08-13 MED ORDER — FLUTICASONE PROPIONATE 50 MCG/ACT NA SUSP: 2.0000 | Freq: Every day | NASAL | 6 refills | Status: AC

## 2022-08-13 MED ORDER — LEVOCETIRIZINE DIHYDROCHLORIDE 5 MG PO TABS: 5.0000 mg | ORAL_TABLET | Freq: Every evening | ORAL | 1 refills | Status: AC

## 2022-08-13 MED ORDER — MECLIZINE HCL 50 MG PO TABS: 50.0000 mg | ORAL_TABLET | Freq: Three times a day (TID) | ORAL | 0 refills | Status: AC | PRN

## 2022-08-13 NOTE — Progress Notes (Signed)
E visit for Allergic Rhinitis We are sorry that you are not feeling well.  Here is how we plan to help!  Based on what you have shared with me it looks like you have Allergic Rhinitis.  Rhinitis is when a reaction occurs that causes nasal congestion, runny nose, sneezing, and itching.  Most types of rhinitis are caused by an inflammation and are associated with symptoms in the eyes ears or throat. There are several types of rhinitis.  The most common are acute rhinitis, which is usually caused by a viral illness, allergic or seasonal rhinitis, and nonallergic or year-round rhinitis.  Nasal allergies occur certain times of the year.  Allergic rhinitis is caused when allergens in the air trigger the release of histamine in the body.  Histamine causes itching, swelling, and fluid to build up in the fragile linings of the nasal passages, sinuses and eyelids.  An itchy nose and clear discharge are common.  I recommend the following over the counter treatments: You should take a daily dose of antihistamine and Xyzal 5 mg take 1 tablet daily  I also would recommend a nasal spray: Flonase 2 sprays into each nostril once daily   HOME CARE:  You can use an over-the-counter saline nasal spray as needed Avoid areas where there is heavy dust, mites, or molds Stay indoors on windy days during the pollen season Keep windows closed in home, at least in bedroom; use air conditioner. Use high-efficiency house air filter Keep windows closed in car, turn AC on re-circulate Avoid playing out with dog during pollen season  GET HELP RIGHT AWAY IF:  If your symptoms do not improve within 10 days You become short of breath You develop yellow or green discharge from your nose for over 3 days You have coughing fits  MAKE SURE YOU:  Understand these instructions Will watch your condition Will get help right away if you are not doing well or get worse  Thank you for choosing an e-visit. Your e-visit answers  were reviewed by a board certified advanced clinical practitioner to complete your personal care plan. Depending upon the condition, your plan could have included both over the counter or prescription medications. Please review your pharmacy choice. Be sure that the pharmacy you have chosen is open so that you can pick up your prescription now.  If there is a problem you may message your provider in MyChart to have the prescription routed to another pharmacy. Your safety is important to us. If you have drug allergies check your prescription carefully.  For the next 24 hours, you can use MyChart to ask questions about today's visit, request a non-urgent call back, or ask for a work or school excuse from your e-visit provider. You will get an email in the next two days asking about your experience. I hope that your e-visit has been valuable and will speed your recovery.   Approximately 5 minutes was spent documenting and reviewing patient's chart.       

## 2022-08-13 NOTE — Addendum Note (Signed)
Addended by: Evelina Dun A on: 08/13/2022 04:55 PM   Modules accepted: Orders

## 2022-08-23 ENCOUNTER — Telehealth: Payer: Medicaid Other | Admitting: Physician Assistant

## 2022-08-23 DIAGNOSIS — R3989 Other symptoms and signs involving the genitourinary system: Secondary | ICD-10-CM | POA: Diagnosis not present

## 2022-08-23 MED ORDER — CEPHALEXIN 500 MG PO CAPS: 500.0000 mg | ORAL_CAPSULE | Freq: Two times a day (BID) | ORAL | 0 refills | Status: AC

## 2022-08-23 NOTE — Progress Notes (Signed)
Message sent to patient requesting further input regarding current symptoms. Awaiting patient response.  

## 2022-08-23 NOTE — Addendum Note (Signed)
Addended by: Waldon Merl on: 08/23/2022 12:26 PM   Modules accepted: Orders

## 2022-08-23 NOTE — Progress Notes (Signed)
I have spent 5 minutes in review of e-visit questionnaire, review and updating patient chart, medical decision making and response to patient.   Quanda Pavlicek Cody Stonewall Doss, PA-C    

## 2022-08-23 NOTE — Progress Notes (Signed)

## 2022-09-15 ENCOUNTER — Ambulatory Visit: Payer: Medicaid Other | Admitting: Nurse Practitioner

## 2022-09-15 ENCOUNTER — Encounter: Payer: Self-pay | Admitting: Nurse Practitioner

## 2022-09-15 ENCOUNTER — Ambulatory Visit (HOSPITAL_COMMUNITY)
Admission: RE | Admit: 2022-09-15 | Discharge: 2022-09-15 | Disposition: A | Discharge status: Home / Self Care | Payer: Medicaid Other | Source: Ambulatory Visit | Attending: Nurse Practitioner | Admitting: Nurse Practitioner

## 2022-09-15 VITALS — BP 110/72 | HR 86 | Ht 62.0 in | Wt 121.0 lb

## 2022-09-15 DIAGNOSIS — D649 Anemia, unspecified: Secondary | ICD-10-CM | POA: Diagnosis not present

## 2022-09-15 DIAGNOSIS — M25552 Pain in left hip: Secondary | ICD-10-CM | POA: Insufficient documentation

## 2022-09-15 DIAGNOSIS — M25551 Pain in right hip: Secondary | ICD-10-CM | POA: Diagnosis not present

## 2022-09-15 DIAGNOSIS — R200 Anesthesia of skin: Secondary | ICD-10-CM | POA: Diagnosis not present

## 2022-09-15 MED ORDER — PREDNISONE 20 MG PO TABS: 20.0000 mg | ORAL_TABLET | Freq: Every day | ORAL | 0 refills | Status: AC

## 2022-09-15 NOTE — Patient Instructions (Addendum)
1. Bilateral hip pain  - DG Hip Unilat W OR W/O Pelvis 2-3 Views Right - DG Hip Unilat W OR W/O Pelvis 2-3 Views Left - Ambulatory referral to Physical Therapy   2. Anemia, unspecified type  - CBC - Comprehensive metabolic panel   Follow up:  Follow up in 3 months

## 2022-09-15 NOTE — Progress Notes (Signed)
@Patient  ID: Mackenzie Gomez, female    DOB: Sep 10, 1981, 41 y.o.   MRN: [id number]  Chief Complaint  Patient presents with   Generalized Body Aches    Pt stated--right is worse than left--numbness, pain, stiff-especially when sitting for a while- 2 years.    Referring provider: Azzie Glatter, FNP   HPI  Patient presents today for generalized body aches.  Patient states that she is having bilateral hip pain.  We will order x-rays and physical therapy.  She states that this has been an issue for several years.  Will check labs today patient does have a history of anemia.  Patient is currently taking ibuprofen for pain. Denies f/c/s, n/v/d, hemoptysis, PND, leg swelling Denies chest pain or edema       Allergies  Allergen Reactions   Ferumoxytol Shortness Of Breath    Nausea, vomiting, and chest pain Nausea, vomiting, and chest pain    Latex Hives, Rash, Itching and Other (See Comments)    unknown    Azo Urinary Tract Support Nausea And Vomiting   Iron Dextran    Penicillins Other (See Comments)    Unknown--childhood reaction    Immunization History  Administered Date(s) Administered   Influenza,inj,Quad PF,6+ Mos 07/15/2018   Influenza-Unspecified 07/30/2014   Moderna Sars-Covid-2 Vaccination 01/06/2020, 02/03/2020, 09/24/2020   PPD Test 09/30/2020   Rho (D) Immune Globulin 12/29/2012    Past Medical History:  Diagnosis Date   Elevated TSH 12/2020   Kidney stone    Mental disorder    Mild intermittent asthma 07/26/2018   Recurrent upper respiratory infection (URI)    Urinary tract infection     Tobacco History: Social History   Tobacco Use  Smoking Status Never  Smokeless Tobacco Never   Counseling given: Not Answered   Outpatient Encounter Medications as of 09/15/2022  Medication Sig   albuterol (VENTOLIN HFA) 108 (90 Base) MCG/ACT inhaler Inhale 1-2 puffs into the lungs every 6 (six) hours as needed for wheezing or shortness of breath.   cetirizine  (ZYRTEC) 10 MG tablet Take 1 tablet (10 mg total) by mouth daily.   escitalopram (LEXAPRO) 10 MG tablet Take 1 tablet (10 mg total) by mouth daily.   fluticasone (FLONASE) 50 MCG/ACT nasal spray Place 2 sprays into both nostrils daily.   ibuprofen (ADVIL,MOTRIN) 200 MG tablet Take 200 mg by mouth every 6 (six) hours as needed.   ipratropium (ATROVENT) 0.03 % nasal spray Place 2 sprays into both nostrils every 12 (twelve) hours. (Patient not taking: Reported on 10/17/2022)   lamoTRIgine (LAMICTAL) 200 MG tablet Take 1 tablet (200 mg total) by mouth daily.   [EXPIRED] predniSONE (DELTASONE) 20 MG tablet Take 1 tablet (20 mg total) by mouth daily with breakfast for 5 days.   zolpidem (AMBIEN) 5 MG tablet Take 1 tablet (5 mg total) by mouth at bedtime as needed for sleep.   levocetirizine (XYZAL) 5 MG tablet Take 1 tablet (5 mg total) by mouth every evening.   meclizine (ANTIVERT) 50 MG tablet Take 1 tablet (50 mg total) by mouth 3 (three) times daily as needed.   [DISCONTINUED] benzonatate (TESSALON) 100 MG capsule Take 1 capsule (100 mg total) by mouth 3 (three) times daily as needed.   [DISCONTINUED] Crisaborole (EUCRISA) 2 % OINT Apply 1 application topically 2 (two) times daily. To skin flare   No facility-administered encounter medications on file as of 09/15/2022.     Review of Systems  Review of Systems  Constitutional: Negative.  HENT: Negative.    Cardiovascular: Negative.   Gastrointestinal: Negative.   Musculoskeletal:  Positive for arthralgias and myalgias.       Bilateral hip pain  Allergic/Immunologic: Negative.   Neurological: Negative.   Psychiatric/Behavioral: Negative.         Physical Exam  BP 110/72   Pulse 86   Ht 5\' 2"  (1.575 m)   Wt 121 lb (54.9 kg)   SpO2 99%   BMI 22.13 kg/m   Wt Readings from Last 5 Encounters:  09/15/22 121 lb (54.9 kg)  12/20/20 124 lb (56.2 kg)  02/11/20 110 lb (49.9 kg)  09/24/18 114 lb (51.7 kg)  07/26/18 109 lb (49.4 kg)      Physical Exam Vitals and nursing note reviewed.  Constitutional:      General: She is not in acute distress.    Appearance: She is well-developed.  Cardiovascular:     Rate and Rhythm: Normal rate and regular rhythm.  Pulmonary:     Effort: Pulmonary effort is normal.     Breath sounds: Normal breath sounds.  Musculoskeletal:     Right hip: Tenderness present. Normal range of motion.     Left hip: Tenderness present. Normal range of motion.  Neurological:     Mental Status: She is alert and oriented to person, place, and time.       Assessment & Plan:   Bilateral hip pain - DG Hip Unilat W OR W/O Pelvis 2-3 Views Right - DG Hip Unilat W OR W/O Pelvis 2-3 Views Left - Ambulatory referral to Physical Therapy   2. Anemia, unspecified type  - CBC - Comprehensive metabolic panel   Follow up:  Follow up in 3 months       Fenton Foy, NP 01/01/2023

## 2022-09-16 LAB — COMPREHENSIVE METABOLIC PANEL
ALT: 46 IU/L — ABNORMAL HIGH (ref 0–32)
AST: 30 IU/L (ref 0–40)
Albumin/Globulin Ratio: 1.8 (ref 1.2–2.2)
Albumin: 4.5 g/dL (ref 3.9–4.9)
Alkaline Phosphatase: 138 IU/L — ABNORMAL HIGH (ref 44–121)
BUN/Creatinine Ratio: 22 (ref 9–23)
BUN: 15 mg/dL (ref 6–24)
Bilirubin Total: 0.5 mg/dL (ref 0.0–1.2)
CO2: 22 mmol/L (ref 20–29)
Calcium: 9.6 mg/dL (ref 8.7–10.2)
Chloride: 101 mmol/L (ref 96–106)
Creatinine, Ser: 0.68 mg/dL (ref 0.57–1.00)
Globulin, Total: 2.5 g/dL (ref 1.5–4.5)
Glucose: 87 mg/dL (ref 70–99)
Potassium: 4.3 mmol/L (ref 3.5–5.2)
Sodium: 138 mmol/L (ref 134–144)
Total Protein: 7 g/dL (ref 6.0–8.5)
eGFR: 112 mL/min/{1.73_m2} (ref >59)

## 2022-09-16 LAB — IRON,TIBC AND FERRITIN PANEL
Ferritin: 48 ng/mL (ref 15–150)
Iron Saturation: 45 % (ref 15–55)
Iron: 140 ug/dL (ref 27–159)
Total Iron Binding Capacity: 312 ug/dL (ref 250–450)
UIBC: 172 ug/dL (ref 131–425)

## 2022-09-16 LAB — CBC
Hematocrit: 40.3 % (ref 34.0–46.6)
Hemoglobin: 13 g/dL (ref 11.1–15.9)
MCH: 28.1 pg (ref 26.6–33.0)
MCHC: 32.3 g/dL (ref 31.5–35.7)
MCV: 87 fL (ref 79–97)
Platelets: 493 10*3/uL — ABNORMAL HIGH (ref 150–450)
RBC: 4.62 x10E6/uL (ref 3.77–5.28)
RDW: 12.5 % (ref 11.7–15.4)
WBC: 8.2 10*3/uL (ref 3.4–10.8)

## 2022-10-10 ENCOUNTER — Telehealth: Payer: Medicaid Other | Admitting: Physician Assistant

## 2022-10-10 DIAGNOSIS — R6889 Other general symptoms and signs: Secondary | ICD-10-CM

## 2022-10-10 DIAGNOSIS — R079 Chest pain, unspecified: Secondary | ICD-10-CM

## 2022-10-10 DIAGNOSIS — R0602 Shortness of breath: Secondary | ICD-10-CM

## 2022-10-10 NOTE — Progress Notes (Signed)
Because of mention of chest pain and shortness of breath with the flu-like symptoms, I feel your condition warrants further evaluation and I recommend that you be seen in a face to face visit.   NOTE: There will be NO CHARGE for this eVisit   If you are having a true medical emergency please call 911.      For an urgent face to face visit, [place] has seven urgent care centers for your convenience:     [facility] at [facility] Directions [phone] [address] [place], [place] [address]    [facility]) Get Driving Directions [phone] [address] [place], [place] [address]  [facility] Center [facility]) Get Driving Directions [phone] [address] [place],  [place]  [address]  [facility] Center [facility] at [employer] Directions  [phone] [address] [place],  [place] [address]   [facility] Ur[facility] Care at [facility] Get Driving Directions [phone] [address], [address] [place], [place] [address]   [facility] Ur[facility] Care at [facility] Get Driving Directions  [phone] [address] [place], [place] [address]   [facility] Urgent Care at [facility] Directions [phone] [address], [address] [place], [place] [address]  Your MyChart E-visit questionnaire answers were reviewed by a board certified advanced clinical practitioner to complete your personal care plan based on your specific symptoms.  Thank you for using e-Visits.

## 2022-10-17 ENCOUNTER — Ambulatory Visit: Payer: Medicaid Other | Attending: Nurse Practitioner

## 2022-10-17 ENCOUNTER — Other Ambulatory Visit: Payer: Self-pay

## 2022-10-17 DIAGNOSIS — M25551 Pain in right hip: Secondary | ICD-10-CM | POA: Diagnosis not present

## 2022-10-17 DIAGNOSIS — R262 Difficulty in walking, not elsewhere classified: Secondary | ICD-10-CM | POA: Diagnosis not present

## 2022-10-17 DIAGNOSIS — M6281 Muscle weakness (generalized): Secondary | ICD-10-CM | POA: Insufficient documentation

## 2022-10-17 DIAGNOSIS — M25552 Pain in left hip: Secondary | ICD-10-CM | POA: Insufficient documentation

## 2022-10-17 NOTE — Therapy (Signed)
OUTPATIENT PHYSICAL THERAPY THORACOLUMBAR EVALUATION   Patient Name: Mackenzie Gomez MRN: [id number] DOB:1981/07/19, 42 y.o., female Today's Date: 10/17/2022  END OF SESSION:  PT End of Session - 10/17/22 1817     Visit Number 1    Number of Visits 9    Date for PT Re-Evaluation 12/19/22    Authorization Type [employer]    Authorization Time Period Pending auth    PT Start Time 1740    PT Stop Time 1825    PT Time Calculation (min) 45 min    Activity Tolerance Patient tolerated treatment well    Behavior During Therapy [facility] for tasks assessed/performed             Past Medical History:  Diagnosis Date   Elevated TSH 12/2020   Kidney stone    Mental disorder    Mild intermittent asthma 07/26/2018   Recurrent upper respiratory infection (URI)    Urinary tract infection    Past Surgical History:  Procedure Laterality Date   NO PAST SURGERIES     TYMPANOSTOMY TUBE PLACEMENT     Patient Active Problem List   Diagnosis Date Noted   Dysmenorrhea 04/01/2019   Menorrhagia with irregular cycle 04/01/2019   Seasonal allergies 04/01/2019   Seasonal and perennial allergic rhinoconjunctivitis 10/28/2018   Dermatitis 07/26/2018   Perennial allergic rhinitis with seasonal variation 07/26/2018   Allergic conjunctivitis, bilateral 07/26/2018   Mild intermittent asthma without complication 29/51/8841   History of penicillin allergy 07/26/2018   Bipolar depression ([place]) 02/13/2018   Anemia 07/09/2014    PCP: Fenton Foy, NP   REFERRING PROVIDER: Fenton Foy, NP   REFERRING DIAG: [phone] (ICD-10-CM) - Bilateral hip pain   Rationale for Evaluation and Treatment: Rehabilitation  THERAPY DIAG:  Pain in left hip  Pain in right hip  Difficulty in walking, not elsewhere classified  Muscle weakness (generalized)  ONSET DATE: 2-3 years ago  SUBJECTIVE:                                                                                                                                                                                            SUBJECTIVE STATEMENT: Pt reports primary c/o BIL anterior/ posterior Rt>Lt hip pain with mild LBP which pt reports she does not think is associated with her hip pain. This pain is of insidious onset and began 2-3 years ago. Pt reports that she experiences BIL Rt>Lt LE N/T travelling down the back of her legs to her feet when sitting on either side. Pt reports that since onset of pain, she has become unable to cross her legs while sitting  due to posterior and anterior hip pain.  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 Current pain is 4/10. Worst pain is 8/10. Best pain is 2/10. Aggravating factors include sitting for any amount of time, laying on either side for any amount of time, sitting with legs crossed. Easing factors include positional change, pain medication, heat. Pt denies any unexplained weight change, saddle anesthesia, changes in bowel/ bladder changes, or nausea/ vomiting.   PERTINENT HISTORY:  Asthma, broken coccyx at 42yo  PAIN:  Are you having pain? Yes: NPRS scale: 4/10 Pain location: BIL anterior/ posterior hips, posterior thighs Pain description: Achy, N/T Aggravating factors: sitting for any amount of time, laying on either side for any amount of time, sitting with legs crossed Relieving factors: positional change, pain medication, heat  PRECAUTIONS: None  WEIGHT BEARING RESTRICTIONS: No  FALLS:  Has patient fallen in last 6 months? Yes. Number of falls 1, pt fell backward about two months ago while walking in the middle of the night on new medication. 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  PLOF: Independent  PATIENT GOALS: Performing work activities, sitting for drives/ movies  OBJECTIVE:   DIAGNOSTIC FINDINGS:  09/15/2022: DG Hip Right and Left: Normal exams  PATIENT SURVEYS:  LEFS 57/80, 71.25% functional ability  SCREENING FOR RED FLAGS: Bowel or bladder incontinence: No Cauda equina syndrome: No  COGNITION: Overall cognitive status: Within functional limits for tasks assessed     SENSATION: Light touch: WFL  MUSCLE LENGTH: Thomas test: Moderately tight BIL  POSTURE: decreased lumbar lordosis  PALPATION: Exquisitely TTP to BIL piriformis, no TTP to BIL lumbar paraspinals  PASSIVE ACCESSORIES: Painful and limited CPAs at T8-L3  LUMBAR ROM:   AROM eval  Flexion WNL  Extension WNL, LBP  Right lateral flexion WNL, LBP  Left lateral flexion WNL, LBP  Right rotation WNL, LBP  Left rotation WNL, LBP   (Blank rows = not tested)  LOWER EXTREMITY ROM:     A/PROM Right eval Left eval  Hip flexion    Hip abduction    Hip adduction    Hip internal rotation 35p!/60p! 35,50p!  Hip external rotation 45p!/ 65p! 35/50p!   (Blank rows = not tested)  LOWER EXTREMITY MMT:    MMT Right eval Left eval  Hip flexion 4/5p! 4/5  Hip extension 3+/5 3+/5  Hip abduction 3+/5 3+/5  Hip internal rotation 3+/5p! 3+/5p!  Hip external rotation 4/5 4/5  Knee flexion 5/5 5/5  Knee extension 5/5 5/5  Ankle dorsiflexion 5/5 5/5  Ankle plantarflexion 5/5 5/5   (Blank rows = not tested)  SPECIAL TESTS:  Piriformis test: (+) BIL FABER: (+) BIL FADDIR: (+) BIL Hip scour: (-) BIL  FUNCTIONAL TESTS:  5xSTS: 13.50 seconds Squat: WNL BIL Lunge: WNL BIL Plank: 33 seconds   TODAY'S TREATMENT:  10/17/2022: Demonstrated and issued HEP    PATIENT EDUCATION:  Education details: Pt educated on probable  underlying pathophysiology, POC, prognosis, LEFS, and HEP Person educated: Patient Education method: Explanation, Demonstration, and Handouts Education comprehension: verbalized understanding and returned demonstration  HOME EXERCISE PROGRAM: Access Code: [email or web address] URL: [email or web address] Date: 10/17/2022 Prepared by: Carmelina Dane  Exercises - Seated Piriformis Stretch  - 1 x daily - 7 x weekly - 2 minutes hold - Supine Piriformis Stretch with Foot on Ground  - 1 x daily - 7 x weekly - 2 min hold - Side Plank on Knees  - 1 x daily - 7 x weekly - 3 sets - 10 reps - Supine Double Knee to Chest  - 1 x daily - 7 x weekly - 2 minutes hold - Standard Plank  - 1 x daily - 7 x weekly - 3 sets - to failure hold - Squat  - 1 x daily - 7 x weekly - 3 sets - 10 reps  ASSESSMENT:  CLINICAL IMPRESSION: Patient is a 42 y.o. F who was seen today for physical therapy evaluation and treatment for chronic BIL hip pain.  Upon assessment, primary impairments include TTP to BIL piriformis, tight BIL piriformis, limited and painful BIL hip IR and ER AROM, weak BIL global BIL hip MMT, weak functional core strength, tight BIL hip flexors, and hypomobile and painful thoracolumbar passive accessory mobility. Ruling up BIL piriformis syndrome due to report of pain with sitting on either side with reproduction of pain and N/T down LE, positive piriformis test, and exquisite TTP to BIL piriformis. Ruling down hip OA/ intraarticular dysfunction due to negative hip scour, recent normal hip radiographs. Cannot rule out mechanical LBP, however, this is unlikely to contribute to hip sxs due to subjective hx and exam findings. Pt will benefit from skilled PT to address her primary impairments and return to her prior level of function with less limitation.  OBJECTIVE IMPAIRMENTS: decreased balance, decreased endurance, decreased mobility, difficulty walking, decreased ROM, decreased strength, hypomobility,  impaired flexibility, impaired sensation, improper body mechanics, postural dysfunction, and pain.   ACTIVITY LIMITATIONS: bending, sitting, squatting, and transfers  PARTICIPATION LIMITATIONS: cleaning, laundry, driving, shopping, community activity, occupation, and yard work  PERSONAL FACTORS: Time since onset of injury/illness/exacerbation and 1-2 comorbidities: asthma, hx of coccygeal fx in childhood  are also affecting patient's functional outcome.   REHAB POTENTIAL: Good  CLINICAL DECISION MAKING: Stable/uncomplicated  EVALUATION COMPLEXITY: Low   GOALS: Goals reviewed with patient? Yes  SHORT TERM GOALS: Target date: 11/14/2022  Pt will report understanding and adherence to initial HEP in order to promote independence in the management of primary impairments. Baseline: HEP provided at eval Goal status: INITIAL    LONG TERM GOALS: Target date: 12/12/2022  Pt will achieve an LEFS score of 84% or greater in order to demonstrate improved functional ability as it relates to the pt's primary impairments. Baseline: 71.25% Goal status: INITIAL  2.  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 Baseline: >4/10 pain with any amount of sitting Goal status: INITIAL  3.  Pt will achieve BIL global hip strength of 4+/5 or greater in order to progress her independent LE strengthening regimen with less limitation. Baseline: See MMT chart Goal status: INITIAL  4.  Pt will achieve WNL global BIL hip IR and ER AROM with 0-2/10 pain in order to sit with legs crossed.  Baseline: See AROM  chart Goal status: INITIAL  5.  Pt will achieve functional forearm plank of 60 seconds in order to promote functional core strength in the prophylaxis of future mechanical LBP. Baseline: 33 seconds Goal status: INITIAL  PLAN:  PT FREQUENCY: 1x/week  PT DURATION: 8 weeks  PLANNED INTERVENTIONS: Therapeutic exercises, Therapeutic  activity, Neuromuscular re-education, Balance training, Gait training, Patient/Family education, Self Care, Joint mobilization, Joint manipulation, Stair training, Aquatic Therapy, Dry Needling, Electrical stimulation, Spinal manipulation, Spinal mobilization, Cryotherapy, Moist heat, Taping, Traction, Biofeedback, Ionotophoresis 4mg /ml Dexamethasone, Manual therapy, and Re-evaluation.  PLAN FOR NEXT SESSION: Progress hip strengthening/ stretching, assess global hip A/PROM*   Check all possible CPT codes: - PT Re-evaluation, 97110- Therapeutic Exercise, [phone]- Neuro Re-education, [phone] - Gait Training, [phone] - Manual Therapy, 97530 - Therapeutic Activities, 97535 - Self Care, [phone] - Mechanical traction, 97014 - Electrical stimulation (unattended), 56979 - Electrical stimulation (Manual), and [id number] - Aquatic therapy    Check all conditions that are expected to impact treatment: Musculoskeletal disorders   If treatment provided at initial evaluation, no treatment charged due to lack of authorization.       [id number], PT, DPT 10/17/22 6:36 PM

## 2022-10-24 ENCOUNTER — Ambulatory Visit: Payer: Medicaid Other

## 2022-10-26 ENCOUNTER — Ambulatory Visit: Payer: Medicaid Other

## 2022-10-26 DIAGNOSIS — F3162 Bipolar disorder, current episode mixed, moderate: Secondary | ICD-10-CM | POA: Diagnosis not present

## 2022-10-26 DIAGNOSIS — G2571 Drug induced akathisia: Secondary | ICD-10-CM | POA: Diagnosis not present

## 2022-10-26 DIAGNOSIS — F419 Anxiety disorder, unspecified: Secondary | ICD-10-CM | POA: Diagnosis not present

## 2022-10-28 ENCOUNTER — Telehealth: Payer: Medicaid Other | Admitting: Physician Assistant

## 2022-10-28 DIAGNOSIS — Z20818 Contact with and (suspected) exposure to other bacterial communicable diseases: Secondary | ICD-10-CM

## 2022-10-28 DIAGNOSIS — J029 Acute pharyngitis, unspecified: Secondary | ICD-10-CM

## 2022-10-28 MED ORDER — AZITHROMYCIN 250 MG PO TABS: ORAL_TABLET | ORAL | 0 refills | Status: AC

## 2022-10-28 NOTE — Progress Notes (Signed)
E-Visit for Sore Throat - Strep Symptoms  We are sorry that you are not feeling well.  Here is how we plan to help!  Based on what you have shared with me it is likely that you have strep pharyngitis.  Strep pharyngitis is inflammation and infection in the back of the throat.  This is an infection cause by bacteria and is treated with antibiotics.  I have prescribed Azithromycin 250 mg two tablets today and then one daily for 4 additional days. For throat pain, we recommend over the counter oral pain relief medications such as acetaminophen or aspirin, or anti-inflammatory medications such as ibuprofen or naproxen sodium. Topical treatments such as oral throat lozenges or sprays may be used as needed. Strep infections are not as easily transmitted as other respiratory infections, however we still recommend that you avoid close contact with loved ones, especially the very young and elderly.  Remember to wash your hands thoroughly throughout the day as this is the number one way to prevent the spread of infection and wipe down door knobs and counters with disinfectant.   Home Care: Only take medications as instructed by your medical team. Complete the entire course of an antibiotic. Do not take these medications with alcohol. A steam or ultrasonic humidifier can help congestion.  You can place a towel over your head and breathe in the steam from hot water coming from a faucet. Avoid close contacts especially the very young and the elderly. Cover your mouth when you cough or sneeze. Always remember to wash your hands.  Get Help Right Away If: You develop worsening fever or sinus pain. You develop a severe head ache or visual changes. Your symptoms persist after you have completed your treatment plan.  Make sure you Understand these instructions. Will watch your condition. Will get help right away if you are not doing well or get worse.   Thank you for choosing an e-visit.  Your e-visit  answers were reviewed by a board certified advanced clinical practitioner to complete your personal care plan. Depending upon the condition, your plan could have included both over the counter or prescription medications.  Please review your pharmacy choice. Make sure the pharmacy is open so you can pick up prescription now. If there is a problem, you may contact your provider through [employer] and have the prescription routed to another pharmacy.  Your safety is important to [place]. If you have drug allergies check your prescription carefully.   For the next 24 hours you can use MyChart to ask questions about today's visit, request a non-urgent call back, or ask for a work or school excuse. You will get an email in the next two days asking about your experience. I hope that your e-visit has been valuable and will speed your recovery.

## 2022-10-28 NOTE — Progress Notes (Signed)
I have spent 5 minutes in review of e-visit questionnaire, review and updating patient chart, medical decision making and response to patient.   Medhansh Brinkmeier Cody Simonne Boulos, PA-C    

## 2022-10-31 ENCOUNTER — Ambulatory Visit: Payer: Medicaid Other

## 2022-11-28 ENCOUNTER — Telehealth: Payer: Medicaid Other | Admitting: Physician Assistant

## 2022-11-28 DIAGNOSIS — R112 Nausea with vomiting, unspecified: Secondary | ICD-10-CM | POA: Diagnosis not present

## 2022-11-28 MED ORDER — ONDANSETRON 4 MG PO TBDP: 4.0000 mg | ORAL_TABLET | Freq: Three times a day (TID) | ORAL | 0 refills | Status: DC | PRN

## 2022-11-28 NOTE — Progress Notes (Signed)
E-Visit for Vomiting  We are sorry that you are not feeling well. Here is how we plan to help!  Based on what you have shared with me it looks like you have a Virus that is irritating your GI tract.  Vomiting is the forceful emptying of a portion of the stomach's content through the mouth.  Although nausea and vomiting can make you feel miserable, it's important to remember that these are not diseases, but rather symptoms of an underlying illness.  When we treat short term symptoms, we always caution that any symptoms that persist should be fully evaluated in a medical office.  I have prescribed a medication that will help alleviate your symptoms and allow you to stay hydrated:  Zofran 4 mg 1 tablet every 8 hours as needed for nausea and vomiting  HOME CARE: Drink clear liquids.  This is very important! Dehydration (the lack of fluid) can lead to a serious complication.  Start off with 1 tablespoon every 5 minutes for 8 hours. You may begin eating bland foods after 8 hours without vomiting.  Start with saltine crackers, white bread, rice, mashed potatoes, applesauce. After 48 hours on a bland diet, you may resume a normal diet. Try to go to sleep.  Sleep often empties the stomach and relieves the need to vomit.  GET HELP RIGHT AWAY IF:  Your symptoms do not improve or worsen within 2 days after treatment. You have a fever for over 3 days. You cannot keep down fluids after trying the medication.  MAKE SURE YOU:  Understand these instructions. Will watch your condition. Will get help right away if you are not doing well or get worse.   Thank you for choosing an e-visit.  Your e-visit answers were reviewed by a board certified advanced clinical practitioner to complete your personal care plan. Depending upon the condition, your plan could have included both over the counter or prescription medications.  Please review your pharmacy choice. Make sure the pharmacy is open so you can pick  up prescription now. If there is a problem, you may contact your provider through [employer] and have the prescription routed to another pharmacy.  Your safety is important to [place]. If you have drug allergies check your prescription carefully.   For the next 24 hours you can use MyChart to ask questions about today's visit, request a non-urgent call back, or ask for a work or school excuse. You will get an email in the next two days asking about your experience. I hope that your e-visit has been valuable and will speed your recovery.

## 2022-12-07 NOTE — Progress Notes (Signed)
I have spent 5 minutes in review of e-visit questionnaire, review and updating patient chart, medical decision making and response to patient.   Jymir Dunaj Cody Danna Sewell, PA-C    

## 2022-12-15 ENCOUNTER — Ambulatory Visit: Payer: Self-pay | Admitting: Nurse Practitioner

## 2022-12-18 ENCOUNTER — Ambulatory Visit: Payer: Self-pay | Admitting: Nurse Practitioner

## 2022-12-20 ENCOUNTER — Ambulatory Visit: Payer: Self-pay | Admitting: Nurse Practitioner

## 2022-12-22 ENCOUNTER — Ambulatory Visit: Payer: Self-pay | Admitting: Nurse Practitioner

## 2022-12-29 ENCOUNTER — Other Ambulatory Visit: Payer: Self-pay | Admitting: Family

## 2023-01-01 ENCOUNTER — Encounter: Payer: Self-pay | Admitting: Nurse Practitioner

## 2023-01-01 DIAGNOSIS — M25551 Pain in right hip: Secondary | ICD-10-CM | POA: Insufficient documentation

## 2023-01-01 NOTE — Assessment & Plan Note (Signed)
-   DG Hip Unilat W OR W/O Pelvis 2-3 Views Right - DG Hip Unilat W OR W/O Pelvis 2-3 Views Left - Ambulatory referral to Physical Therapy   2. Anemia, unspecified type  - CBC - Comprehensive metabolic panel   Follow up:  Follow up in 3 months

## 2023-01-02 DIAGNOSIS — F419 Anxiety disorder, unspecified: Secondary | ICD-10-CM | POA: Diagnosis not present

## 2023-01-02 DIAGNOSIS — Z1331 Encounter for screening for depression: Secondary | ICD-10-CM | POA: Diagnosis not present

## 2023-01-02 DIAGNOSIS — F349 Persistent mood [affective] disorder, unspecified: Secondary | ICD-10-CM | POA: Diagnosis not present

## 2023-01-18 ENCOUNTER — Ambulatory Visit: Payer: Medicaid Other | Admitting: Nurse Practitioner

## 2023-01-18 ENCOUNTER — Encounter: Payer: Self-pay | Admitting: Nurse Practitioner

## 2023-01-18 VITALS — BP 102/68 | HR 70 | Temp 98.3°F | Ht 62.0 in | Wt 123.6 lb

## 2023-01-18 DIAGNOSIS — Z1329 Encounter for screening for other suspected endocrine disorder: Secondary | ICD-10-CM

## 2023-01-18 DIAGNOSIS — D649 Anemia, unspecified: Secondary | ICD-10-CM | POA: Diagnosis not present

## 2023-01-18 DIAGNOSIS — R7989 Other specified abnormal findings of blood chemistry: Secondary | ICD-10-CM

## 2023-01-18 DIAGNOSIS — E079 Disorder of thyroid, unspecified: Secondary | ICD-10-CM | POA: Diagnosis not present

## 2023-01-18 NOTE — Patient Instructions (Addendum)
1. Thyroid disorder screen  - Thyroid Panel With TSH   2. Elevated liver function tests  - CBC - Comprehensive metabolic panel   3. Anemia, unspecified type  - Iron, TIBC and Ferritin Panel   Follow up:  Follow up in 6 months

## 2023-01-18 NOTE — Progress Notes (Signed)
@Patient  ID: Mackenzie Gomez, female    DOB: Oct 20, 1980, 42 y.o.   MRN: [id number]  Chief Complaint  Patient presents with   Follow-up    Lab work. Pt is fasting.    Referring provider: Ivonne Andrew, NP   HPI   Patient presents today for follow-up visit.  At her last visit her thyroid level and liver function were abnormal.  We will recheck blood work today. Denies f/c/s, n/v/d, hemoptysis, PND, leg swelling Denies chest pain or edema     Allergies  Allergen Reactions   Ferumoxytol Shortness Of Breath    Nausea, vomiting, and chest pain Nausea, vomiting, and chest pain    Latex Hives, Rash, Itching and Other (See Comments)    unknown    Azo Urinary Tract Support Nausea And Vomiting   Iron Dextran    Penicillins Other (See Comments)    Unknown--childhood reaction    Immunization History  Administered Date(s) Administered   Influenza,inj,Quad PF,6+ Mos 07/15/2018   Influenza-Unspecified 07/30/2014   Moderna Sars-Covid-2 Vaccination 01/06/2020, 02/03/2020, 09/24/2020   PPD Test 09/30/2020   Rho (D) Immune Globulin 12/29/2012    Past Medical History:  Diagnosis Date   Elevated TSH 12/2020   Kidney stone    Mental disorder    Mild intermittent asthma 07/26/2018   Recurrent upper respiratory infection (URI)    Urinary tract infection     Tobacco History: Social History   Tobacco Use  Smoking Status Never  Smokeless Tobacco Never   Counseling given: Not Answered   Outpatient Encounter Medications as of 01/18/2023  Medication Sig   albuterol (VENTOLIN HFA) 108 (90 Base) MCG/ACT inhaler Inhale 1-2 puffs into the lungs every 6 (six) hours as needed for wheezing or shortness of breath.   cetirizine (ZYRTEC) 10 MG tablet Take 1 tablet (10 mg total) by mouth daily.   escitalopram (LEXAPRO) 10 MG tablet Take 1 tablet (10 mg total) by mouth daily.   fluticasone (FLONASE) 50 MCG/ACT nasal spray Place 2 sprays into both nostrils daily.   ibuprofen (ADVIL,MOTRIN)  200 MG tablet Take 200 mg by mouth every 6 (six) hours as needed.   lamoTRIgine (LAMICTAL) 200 MG tablet Take 1 tablet (200 mg total) by mouth daily.   levocetirizine (XYZAL) 5 MG tablet Take 1 tablet (5 mg total) by mouth every evening.   ondansetron (ZOFRAN-ODT) 4 MG disintegrating tablet Take 1 tablet (4 mg total) by mouth every 8 (eight) hours as needed for nausea or vomiting.   zolpidem (AMBIEN) 5 MG tablet Take 1 tablet (5 mg total) by mouth at bedtime as needed for sleep.   ipratropium (ATROVENT) 0.03 % nasal spray Place 2 sprays into both nostrils every 12 (twelve) hours. (Patient not taking: Reported on 10/17/2022)   meclizine (ANTIVERT) 50 MG tablet Take 1 tablet (50 mg total) by mouth 3 (three) times daily as needed. (Patient not taking: Reported on 01/18/2023)   No facility-administered encounter medications on file as of 01/18/2023.     Review of Systems  Review of Systems  Constitutional: Negative.   HENT: Negative.    Cardiovascular: Negative.   Gastrointestinal: Negative.   Allergic/Immunologic: Negative.   Neurological: Negative.   Psychiatric/Behavioral: Negative.         Physical Exam  BP 102/68   Pulse 70   Temp 98.3 F (36.8 C)   Ht 5\' 2"  (1.575 m)   Wt 123 lb 9.6 oz (56.1 kg)   SpO2 100%   BMI 22.61 kg/m  Wt Readings from Last 5 Encounters:  01/18/23 123 lb 9.6 oz (56.1 kg)  09/15/22 121 lb (54.9 kg)  12/20/20 124 lb (56.2 kg)  02/11/20 110 lb (49.9 kg)  09/24/18 114 lb (51.7 kg)     Physical Exam Vitals and nursing note reviewed.  Constitutional:      General: She is not in acute distress.    Appearance: She is well-developed.  Cardiovascular:     Rate and Rhythm: Normal rate and regular rhythm.  Pulmonary:     Effort: Pulmonary effort is normal.     Breath sounds: Normal breath sounds.  Neurological:     Mental Status: She is alert and oriented to person, place, and time.      Lab Results:  CBC    Component Value Date/Time   WBC  8.2 09/15/2022 1504   WBC 9.3 02/11/2020 1233   RBC 4.62 09/15/2022 1504   RBC 4.32 02/11/2020 1233   HGB 13.0 09/15/2022 1504   HCT 40.3 09/15/2022 1504   PLT 493 (H) 09/15/2022 1504   MCV 87 09/15/2022 1504   MCH 28.1 09/15/2022 1504   MCH 28.0 02/11/2020 1233   MCHC 32.3 09/15/2022 1504   MCHC 31.4 02/11/2020 1233   RDW 12.5 09/15/2022 1504   LYMPHSABS 3.5 (H) 12/20/2020 1036   MONOABS 0.5 04/09/2017 0352   EOSABS 0.5 (H) 12/20/2020 1036   BASOSABS 0.0 12/20/2020 1036    BMET    Component Value Date/Time   NA 138 09/15/2022 1504   K 4.3 09/15/2022 1504   CL 101 09/15/2022 1504   CO2 22 09/15/2022 1504   GLUCOSE 87 09/15/2022 1504   GLUCOSE 95 02/11/2020 1233   BUN 15 09/15/2022 1504   CREATININE 0.68 09/15/2022 1504   CALCIUM 9.6 09/15/2022 1504   GFRNONAA >60 02/11/2020 1233   GFRAA >60 02/11/2020 1233     Assessment & Plan:   - Thyroid Panel With TSH   2. Elevated liver function tests  - CBC - Comprehensive metabolic panel   3. Anemia, unspecified type  - Iron, TIBC and Ferritin Panel   Follow up:  Follow up in 6 months    Ivonne Andrew, NP 01/18/2023

## 2023-01-19 LAB — IRON,TIBC AND FERRITIN PANEL
Ferritin: 54 ng/mL (ref 15–150)
Iron Saturation: 27 % (ref 15–55)
Iron: 78 ug/dL (ref 27–159)
Total Iron Binding Capacity: 288 ug/dL (ref 250–450)
UIBC: 210 ug/dL (ref 131–425)

## 2023-01-19 LAB — COMPREHENSIVE METABOLIC PANEL
ALT: 29 IU/L (ref 0–32)
AST: 21 IU/L (ref 0–40)
Albumin/Globulin Ratio: 1.8 (ref 1.2–2.2)
Albumin: 4.7 g/dL (ref 3.9–4.9)
Alkaline Phosphatase: 113 IU/L (ref 44–121)
BUN/Creatinine Ratio: 16 (ref 9–23)
BUN: 11 mg/dL (ref 6–24)
Bilirubin Total: 0.3 mg/dL (ref 0.0–1.2)
CO2: 21 mmol/L (ref 20–29)
Calcium: 9.6 mg/dL (ref 8.7–10.2)
Chloride: 101 mmol/L (ref 96–106)
Creatinine, Ser: 0.67 mg/dL (ref 0.57–1.00)
Globulin, Total: 2.6 g/dL (ref 1.5–4.5)
Glucose: 86 mg/dL (ref 70–99)
Potassium: 3.9 mmol/L (ref 3.5–5.2)
Sodium: 137 mmol/L (ref 134–144)
Total Protein: 7.3 g/dL (ref 6.0–8.5)
eGFR: 113 mL/min/{1.73_m2} (ref >59)

## 2023-01-19 LAB — CBC
Hematocrit: 40 % (ref 34.0–46.6)
Hemoglobin: 13.1 g/dL (ref 11.1–15.9)
MCH: 28.4 pg (ref 26.6–33.0)
MCHC: 32.8 g/dL (ref 31.5–35.7)
MCV: 87 fL (ref 79–97)
Platelets: 430 10*3/uL (ref 150–450)
RBC: 4.62 x10E6/uL (ref 3.77–5.28)
RDW: 12.1 % (ref 11.7–15.4)
WBC: 9.5 10*3/uL (ref 3.4–10.8)

## 2023-01-19 LAB — THYROID PANEL WITH TSH
Free Thyroxine Index: 1.9 (ref 1.2–4.9)
T3 Uptake Ratio: 23 % — ABNORMAL LOW (ref 24–39)
T4, Total: 8.2 ug/dL (ref 4.5–12.0)
TSH: 1.4 u[IU]/mL (ref 0.450–4.500)

## 2023-01-22 ENCOUNTER — Encounter: Payer: Self-pay | Admitting: Nurse Practitioner

## 2023-01-22 DIAGNOSIS — Z1329 Encounter for screening for other suspected endocrine disorder: Secondary | ICD-10-CM | POA: Insufficient documentation

## 2023-01-24 DIAGNOSIS — F349 Persistent mood [affective] disorder, unspecified: Secondary | ICD-10-CM | POA: Diagnosis not present

## 2023-01-24 DIAGNOSIS — F419 Anxiety disorder, unspecified: Secondary | ICD-10-CM | POA: Diagnosis not present

## 2023-02-14 DIAGNOSIS — F349 Persistent mood [affective] disorder, unspecified: Secondary | ICD-10-CM | POA: Diagnosis not present

## 2023-02-14 DIAGNOSIS — F419 Anxiety disorder, unspecified: Secondary | ICD-10-CM | POA: Diagnosis not present

## 2023-02-26 DIAGNOSIS — F319 Bipolar disorder, unspecified: Secondary | ICD-10-CM | POA: Diagnosis not present

## 2023-02-26 DIAGNOSIS — F419 Anxiety disorder, unspecified: Secondary | ICD-10-CM | POA: Diagnosis not present

## 2023-03-12 ENCOUNTER — Telehealth: Payer: Medicaid Other | Admitting: Nurse Practitioner

## 2023-03-12 DIAGNOSIS — J0140 Acute pansinusitis, unspecified: Secondary | ICD-10-CM | POA: Diagnosis not present

## 2023-03-12 MED ORDER — DOXYCYCLINE HYCLATE 100 MG PO TABS
100.0000 mg | ORAL_TABLET | Freq: Two times a day (BID) | ORAL | 0 refills | Status: DC
Start: 2023-03-12 — End: 2023-03-22

## 2023-03-12 MED ORDER — IPRATROPIUM BROMIDE 0.03 % NA SOLN
2.0000 | Freq: Two times a day (BID) | NASAL | 12 refills | Status: AC
Start: 2023-03-12 — End: ?

## 2023-03-12 NOTE — Progress Notes (Signed)
E-Visit for Sinus Problems  We are sorry that you are not feeling well.  Here is how we plan to help!  Based on what you have shared with me it looks like you have sinusitis.  Sinusitis is inflammation and infection in the sinus cavities of the head.  Based on your presentation I believe you most likely have Acute Bacterial Sinusitis.  This is an infection caused by bacteria and is treated with antibiotics. I have prescribed Doxycycline 100mg  by mouth twice a day for 10 days. and a nasal spray to use as well.    Meds ordered this encounter  Medications   doxycycline (VIBRA-TABS) 100 MG tablet    Sig: Take 1 tablet (100 mg total) by mouth 2 (two) times daily for 10 days.    Dispense:  20 tablet    Refill:  0   ipratropium (ATROVENT) 0.03 % nasal spray    Sig: Place 2 sprays into both nostrils every 12 (twelve) hours.    Dispense:  30 mL    Refill:  12     You may use an oral decongestant such as Mucinex D or if you have glaucoma or high blood pressure use plain Mucinex. Saline nasal spray help and can safely be used as often as needed for congestion.  If you develop worsening sinus pain, fever or notice severe headache and vision changes, or if symptoms are not better after completion of antibiotic, please schedule an appointment with a health care provider.    Sinus infections are not as easily transmitted as other respiratory infection, however we still recommend that you avoid close contact with loved ones, especially the very young and elderly.  Remember to wash your hands thoroughly throughout the day as this is the number one way to prevent the spread of infection!  Home Care: Only take medications as instructed by your medical team. Complete the entire course of an antibiotic. Do not take these medications with alcohol. A steam or ultrasonic humidifier can help congestion.  You can place a towel over your head and breathe in the steam from hot water coming from a faucet. Avoid close  contacts especially the very young and the elderly. Cover your mouth when you cough or sneeze. Always remember to wash your hands.  Get Help Right Away If: You develop worsening fever or sinus pain. You develop a severe head ache or visual changes. Your symptoms persist after you have completed your treatment plan.  Make sure you Understand these instructions. Will watch your condition. Will get help right away if you are not doing well or get worse.  Thank you for choosing an e-visit.  Your e-visit answers were reviewed by a board certified advanced clinical practitioner to complete your personal care plan. Depending upon the condition, your plan could have included both over the counter or prescription medications.  Please review your pharmacy choice. Make sure the pharmacy is open so you can pick up prescription now. If there is a problem, you may contact your provider through [employer] and have the prescription routed to another pharmacy.  Your safety is important to [place]. If you have drug allergies check your prescription carefully.   For the next 24 hours you can use MyChart to ask questions about today's visit, request a non-urgent call back, or ask for a work or school excuse. You will get an email in the next two days asking about your experience. I hope that your e-visit has been valuable and will  speed your recovery.   I spent approximately 5 minutes reviewing the patient's history, current symptoms and coordinating their care today.

## 2023-03-21 ENCOUNTER — Telehealth: Payer: Medicaid Other | Admitting: Physician Assistant

## 2023-03-21 DIAGNOSIS — A084 Viral intestinal infection, unspecified: Secondary | ICD-10-CM | POA: Diagnosis not present

## 2023-03-22 MED ORDER — ONDANSETRON 4 MG PO TBDP
4.0000 mg | ORAL_TABLET | Freq: Three times a day (TID) | ORAL | 0 refills | Status: AC | PRN
Start: 2023-03-22 — End: ?

## 2023-03-22 NOTE — Progress Notes (Signed)

## 2023-03-22 NOTE — Progress Notes (Signed)
I have spent 5 minutes in review of e-visit questionnaire, review and updating patient chart, medical decision making and response to patient.   Karysa Heft Cody Kenslie Abbruzzese, PA-C    

## 2023-03-27 DIAGNOSIS — F419 Anxiety disorder, unspecified: Secondary | ICD-10-CM | POA: Diagnosis not present

## 2023-03-27 DIAGNOSIS — F349 Persistent mood [affective] disorder, unspecified: Secondary | ICD-10-CM | POA: Diagnosis not present

## 2023-05-23 DIAGNOSIS — G2571 Drug induced akathisia: Secondary | ICD-10-CM | POA: Diagnosis not present

## 2023-05-23 DIAGNOSIS — F349 Persistent mood [affective] disorder, unspecified: Secondary | ICD-10-CM | POA: Diagnosis not present

## 2023-05-23 DIAGNOSIS — F419 Anxiety disorder, unspecified: Secondary | ICD-10-CM | POA: Diagnosis not present

## 2023-07-20 ENCOUNTER — Ambulatory Visit: Payer: Self-pay | Admitting: Nurse Practitioner

## 2023-07-27 DIAGNOSIS — F419 Anxiety disorder, unspecified: Secondary | ICD-10-CM | POA: Diagnosis not present

## 2023-07-27 DIAGNOSIS — G2571 Drug induced akathisia: Secondary | ICD-10-CM | POA: Diagnosis not present

## 2023-07-27 DIAGNOSIS — F349 Persistent mood [affective] disorder, unspecified: Secondary | ICD-10-CM | POA: Diagnosis not present

## 2023-09-25 DIAGNOSIS — Z79899 Other long term (current) drug therapy: Secondary | ICD-10-CM | POA: Diagnosis not present

## 2023-09-25 DIAGNOSIS — F419 Anxiety disorder, unspecified: Secondary | ICD-10-CM | POA: Diagnosis not present

## 2023-09-25 DIAGNOSIS — F349 Persistent mood [affective] disorder, unspecified: Secondary | ICD-10-CM | POA: Diagnosis not present

## 2023-12-03 DIAGNOSIS — F419 Anxiety disorder, unspecified: Secondary | ICD-10-CM | POA: Diagnosis not present

## 2023-12-03 DIAGNOSIS — F349 Persistent mood [affective] disorder, unspecified: Secondary | ICD-10-CM | POA: Diagnosis not present

## 2023-12-27 DIAGNOSIS — F419 Anxiety disorder, unspecified: Secondary | ICD-10-CM | POA: Diagnosis not present

## 2023-12-27 DIAGNOSIS — F349 Persistent mood [affective] disorder, unspecified: Secondary | ICD-10-CM | POA: Diagnosis not present

## 2024-01-09 DIAGNOSIS — F900 Attention-deficit hyperactivity disorder, predominantly inattentive type: Secondary | ICD-10-CM | POA: Diagnosis not present

## 2024-01-09 DIAGNOSIS — F419 Anxiety disorder, unspecified: Secondary | ICD-10-CM | POA: Diagnosis not present

## 2024-01-09 DIAGNOSIS — F349 Persistent mood [affective] disorder, unspecified: Secondary | ICD-10-CM | POA: Diagnosis not present

## 2024-01-20 ENCOUNTER — Telehealth: Admitting: Family

## 2024-01-20 DIAGNOSIS — H66001 Acute suppurative otitis media without spontaneous rupture of ear drum, right ear: Secondary | ICD-10-CM | POA: Diagnosis not present

## 2024-01-20 MED ORDER — CIPROFLOXACIN HCL 500 MG PO TABS
500.0000 mg | ORAL_TABLET | Freq: Two times a day (BID) | ORAL | 0 refills | Status: DC
Start: 2024-01-20 — End: 2024-04-12

## 2024-01-20 NOTE — Progress Notes (Signed)
 E Visit for Ear Pain - Swimmer's Ear  We are sorry that you are not feeling well. Here is how we plan to help!  Based on what you have shared with me it looks like you have Swimmer's Ear.  Swimmer's ear is a redness or swelling, irritation, or infection of your outer ear canal. These symptoms usually occur within a few days of swimming. Your ear canal is a tube that goes from the opening of the ear to the eardrum.  When water stays in your ear canal, germs can grow.  This is a painful condition that often happens to children and swimmers of all ages.  It is not contagious and oral antibiotics are not required to treat uncomplicated swimmer's ear.  The usual symptoms include:    Itchiness inside the ear  Redness or a sense of swelling in the ear  Pain when the ear is tugged on when pressure is placed on the ear  Pus draining from the infected ear   I have prescribed Cipro 500 mg one tablet by mouth twice a day for 10 days    In certain cases, swimmer's ear may progress to a more serious bacterial infection of the middle or inner ear.  If you have a fever 102 and up and significantly worsening symptoms, this could indicate a more serious infection moving to the middle/inner and needs face to face evaluation in an office by a provider.  Your symptoms should improve over the next 3 days and should resolve in about 7 days.  Be sure to complete ALL of your prescription.  HOME CARE: Wash your hands frequently. If you are prescribed an ear drop, do not place the tip of the bottle on your ear or touch it with your fingers. You can take Acetaminophen 650 mg every 4-6 hours as needed for pain.  If pain is severe or moderate, you can apply a heating pad (set on low) or hot water bottle (wrapped in a towel) to outer ear for 20 minutes.  This will also increase drainage. Avoid ear plugs Do not go swimming until the symptoms are gone Do not use Q-tips After showers, help the water run out by tilting your  head to one side.   GET HELP RIGHT AWAY IF: Fever is over 102.2 degrees. You develop progressive ear pain or hearing loss. Ear symptoms persist longer than 3 days after treatment.  MAKE SURE YOU: Understand these instructions. Will watch your condition. Will get help right away if you are not doing well or get worse.  TO PREVENT SWIMMER'S EAR: Use a bathing cap or custom fitted swim molds to keep your ears dry. Towel off after swimming to dry your ears. Tilt your head or pull your earlobes to allow the water to escape your ear canal. If there is still water in your ears, consider using a hairdryer on the lowest setting.  Thank you for choosing an e-visit.  Your e-visit answers were reviewed by a board certified advanced clinical practitioner to complete your personal care plan. Depending upon the condition, your plan could have included both over the counter or prescription medications.  Please review your pharmacy choice. Make sure the pharmacy is open so you can pick up the prescription now. If there is a problem, you may contact your provider through [employer] and have the prescription routed to another pharmacy.  Your safety is important to [place]. If you have drug allergies check your prescription carefully.   For the  next 24 hours you can use MyChart to ask questions about today's visit, request a non-urgent call back, or ask for a work or school excuse. You will get an email with a survey after your eVisit asking about your experience. We would appreciate your feedback. I hope that your e-visit has been valuable and will aid in your recovery.   Approximately 5 minutes was spent documenting and reviewing patient's chart.

## 2024-03-24 DIAGNOSIS — F349 Persistent mood [affective] disorder, unspecified: Secondary | ICD-10-CM | POA: Diagnosis not present

## 2024-03-24 DIAGNOSIS — F419 Anxiety disorder, unspecified: Secondary | ICD-10-CM | POA: Diagnosis not present

## 2024-03-24 DIAGNOSIS — F900 Attention-deficit hyperactivity disorder, predominantly inattentive type: Secondary | ICD-10-CM | POA: Diagnosis not present

## 2024-04-12 ENCOUNTER — Telehealth: Admitting: Family Medicine

## 2024-04-12 DIAGNOSIS — R3989 Other symptoms and signs involving the genitourinary system: Secondary | ICD-10-CM | POA: Diagnosis not present

## 2024-04-12 MED ORDER — CIPROFLOXACIN HCL 500 MG PO TABS
500.0000 mg | ORAL_TABLET | Freq: Two times a day (BID) | ORAL | 0 refills | Status: AC
Start: 2024-04-12 — End: 2024-04-17

## 2024-04-12 NOTE — Progress Notes (Signed)
 E-Visit for Urinary Problems  We are sorry that you are not feeling well.  Here is how we plan to help!  Based on what you shared with me it looks like you most likely have a simple urinary tract infection.  A UTI (Urinary Tract Infection) is a bacterial infection of the bladder.  Most cases of urinary tract infections are simple to treat but a key part of your care is to encourage you to drink plenty of fluids and watch your symptoms carefully.  I have prescribed Cipro  500 mg.  Your symptoms should gradually improve. Call us  if the burning in your urine worsens, you develop worsening fever, back pain or pelvic pain or if your symptoms do not resolve after completing the antibiotic.  Urinary tract infections can be prevented by drinking plenty of water to keep your body hydrated.  Also be sure when you wipe, wipe from front to back and don't hold it in!  If possible, empty your bladder every 4 hours.  HOME CARE Drink plenty of fluids Compete the full course of the antibiotics even if the symptoms resolve Remember, when you need to go.go. Holding in your urine can increase the likelihood of getting a UTI! GET HELP RIGHT AWAY IF: You cannot urinate You get a high fever Worsening back pain occurs You see blood in your urine You feel sick to your stomach or throw up You feel like you are going to pass out  MAKE SURE YOU  Understand these instructions. Will watch your condition. Will get help right away if you are not doing well or get worse.   Thank you for choosing an e-visit.  Your e-visit answers were reviewed by a board certified advanced clinical practitioner to complete your personal care plan. Depending upon the condition, your plan could have included both over the counter or prescription medications.  Please review your pharmacy choice. Make sure the pharmacy is open so you can pick up prescription now. If there is a problem, you may contact your provider through [employer] and have the prescription routed to another pharmacy.  Your safety is important to us . If you have drug allergies check your prescription carefully.   For the next 24 hours you can use MyChart to ask questions about today's visit, request a non-urgent call back, or ask for a work or school excuse. You will get an email in the next two days asking about your experience. I hope that your e-visit has been valuable and will speed your recovery.  I have spent 5 minutes in review of e-visit questionnaire, review and updating patient chart, medical decision making and response to patient.   Roosvelt Mater, PA-C

## 2024-08-21 DIAGNOSIS — F349 Persistent mood [affective] disorder, unspecified: Secondary | ICD-10-CM | POA: Diagnosis not present

## 2024-08-21 DIAGNOSIS — F900 Attention-deficit hyperactivity disorder, predominantly inattentive type: Secondary | ICD-10-CM | POA: Diagnosis not present

## 2024-08-21 DIAGNOSIS — F419 Anxiety disorder, unspecified: Secondary | ICD-10-CM | POA: Diagnosis not present

## 2024-08-29 DIAGNOSIS — F3132 Bipolar disorder, current episode depressed, moderate: Secondary | ICD-10-CM | POA: Diagnosis not present
